# Patient Record
Sex: Female | Born: 1945 | Race: White | Hispanic: No | Marital: Married | Smoking: Never smoker
Health system: Southern US, Community
[De-identification: ages and names within clinical notes are randomized; demographics above are authoritative.]

## PROBLEM LIST (undated history)

## (undated) HISTORY — PX: TUBAL LIGATION: SHX77

---

## 2008-06-15 DIAGNOSIS — E039 Hypothyroidism, unspecified: Secondary | ICD-10-CM | POA: Insufficient documentation

## 2009-07-05 DIAGNOSIS — E785 Hyperlipidemia, unspecified: Secondary | ICD-10-CM | POA: Insufficient documentation

## 2011-07-10 DIAGNOSIS — E0500 Thyrotoxicosis with diffuse goiter without thyrotoxic crisis or storm: Secondary | ICD-10-CM | POA: Insufficient documentation

## 2012-01-26 DIAGNOSIS — F4321 Adjustment disorder with depressed mood: Secondary | ICD-10-CM | POA: Insufficient documentation

## 2013-01-17 DIAGNOSIS — Z6826 Body mass index (BMI) 26.0-26.9, adult: Secondary | ICD-10-CM | POA: Insufficient documentation

## 2013-06-20 ENCOUNTER — Ambulatory Visit (INDEPENDENT_AMBULATORY_CARE_PROVIDER_SITE_OTHER): Payer: Medicare Other | Admitting: Sports Medicine

## 2013-06-20 ENCOUNTER — Ambulatory Visit (INDEPENDENT_AMBULATORY_CARE_PROVIDER_SITE_OTHER): Payer: Medicare Other

## 2013-06-20 ENCOUNTER — Encounter: Payer: Self-pay | Admitting: Sports Medicine

## 2013-06-20 VITALS — BP 112/68 | HR 74 | Ht 60.0 in | Wt 142.0 lb

## 2013-06-20 DIAGNOSIS — M1711 Unilateral primary osteoarthritis, right knee: Secondary | ICD-10-CM

## 2013-06-20 DIAGNOSIS — M1710 Unilateral primary osteoarthritis, unspecified knee: Secondary | ICD-10-CM

## 2013-06-20 DIAGNOSIS — IMO0002 Reserved for concepts with insufficient information to code with codable children: Secondary | ICD-10-CM

## 2013-06-20 DIAGNOSIS — M25569 Pain in unspecified knee: Secondary | ICD-10-CM

## 2013-06-20 MED ORDER — MELOXICAM 15 MG PO TABS: ORAL_TABLET | ORAL | Status: DC

## 2013-06-20 NOTE — Progress Notes (Signed)
   Subjective:    I'm seeing this patient as a consultation for:  Dr. Cindie Laroche  CC: Right knee pain  HPI: This is a very pleasant 68 year old female, for some time now she's noted an increasing amount of pain and swelling in her right knee, moderate, persistent, worse at the joint lines, over-the-counter analgesics have been ineffective.  Past medical history, Surgical history, Family history not pertinant except as noted below, Social history, Allergies, and medications have been entered into the medical record, reviewed, and no changes needed.   Review of Systems: No headache, visual changes, nausea, vomiting, diarrhea, constipation, dizziness, abdominal pain, skin rash, fevers, chills, night sweats, weight loss, swollen lymph nodes, body aches, joint swelling, muscle aches, chest pain, shortness of breath, mood changes, visual or auditory hallucinations.   Objective:   General: Well Developed, well nourished, and in no acute distress.  Neuro/Psych: Alert and oriented x3, extra-ocular muscles intact, able to move all 4 extremities, sensation grossly intact. Skin: Warm and dry, no rashes noted.  Respiratory: Not using accessory muscles, speaking in full sentences, trachea midline.  Cardiovascular: Pulses palpable, no extremity edema. Abdomen: Does not appear distended. Right Knee: Visible and palpable effusion with fluid wave ROM full in flexion and extension and lower leg rotation. Ligaments with solid consistent endpoints including ACL, PCL, LCL, MCL. Negative Mcmurray's, Apley's, and Thessalonian tests. Non painful patellar compression. Patellar glide without crepitus. Patellar and quadriceps tendons unremarkable. Hamstring and quadriceps strength is normal.   Procedure: Real-time Ultrasound Guided Injection of right knee Device: GE Logiq E  Verbal informed consent obtained.  Time-out conducted.  Noted no overlying erythema, induration, or other signs of local infection.    Skin prepped in a sterile fashion.  Local anesthesia: Topical Ethyl chloride.  With sterile technique and under real time ultrasound guidance: Aspirated 10 cc of straw-colored fluid, syringe switched and 2 cc Kenalog 40, 4 cc lidocaine injected easily.  Completed without difficulty  Pain immediately resolved suggesting accurate placement of the medication.  Advised to call if fevers/chills, erythema, induration, drainage, or persistent bleeding.  Images permanently stored and available for review in the ultrasound unit.  Impression: Technically successful ultrasound guided injection.  Impression and Recommendations:   This case required medical decision making of moderate complexity.

## 2013-06-20 NOTE — Assessment & Plan Note (Signed)
Aspiration injection, Mobic, home rehabilitation, x-rays. Return in one month.

## 2013-06-23 ENCOUNTER — Telehealth: Payer: Self-pay | Admitting: *Deleted

## 2013-06-23 NOTE — Telephone Encounter (Signed)
Pt left vm stating that you pulled fluid off her knee Monday but now she's having pain again & is having some trouble walking on it.

## 2013-06-23 NOTE — Telephone Encounter (Signed)
Is very redness, swelling, fevers, or chills question mark I can add some tramadol if she would like, the steroid will start working soon.

## 2013-06-23 NOTE — Telephone Encounter (Signed)
Spoke with pt & she said the other side of her knee is swollen now & she's already made an appt to see you tomorrow.

## 2013-06-24 ENCOUNTER — Encounter: Payer: Self-pay | Admitting: Sports Medicine

## 2013-06-24 ENCOUNTER — Ambulatory Visit (INDEPENDENT_AMBULATORY_CARE_PROVIDER_SITE_OTHER): Payer: Medicare Other | Admitting: Sports Medicine

## 2013-06-24 VITALS — BP 128/73 | HR 73 | Ht 61.0 in | Wt 142.0 lb

## 2013-06-24 DIAGNOSIS — IMO0002 Reserved for concepts with insufficient information to code with codable children: Secondary | ICD-10-CM

## 2013-06-24 DIAGNOSIS — M1711 Unilateral primary osteoarthritis, right knee: Secondary | ICD-10-CM

## 2013-06-24 DIAGNOSIS — M1710 Unilateral primary osteoarthritis, unspecified knee: Secondary | ICD-10-CM

## 2013-06-24 NOTE — Progress Notes (Signed)
    Subjective:    CC: Reinjured knee  HPI: Injected this pleasant 68 year-old females knee several days back, she had only a minor improvement in her pain but unfortunately twisted her knee recently and has a recurrence of pain in the posterior lateral joint line. Is moderate, persistent.  Past medical history, Surgical history, Family history not pertinant except as noted below, Social history, Allergies, and medications have been entered into the medical record, reviewed, and no changes needed.   Review of Systems: No fevers, chills, night sweats, weight loss, chest pain, or shortness of breath.   Objective:    General: Well Developed, well nourished, and in no acute distress.  Neuro: Alert and oriented x3, extra-ocular muscles intact, sensation grossly intact.  HEENT: Normocephalic, atraumatic, pupils equal round reactive to light, neck supple, no masses, no lymphadenopathy, thyroid nonpalpable.  Skin: Warm and dry, no rashes. Cardiac: Regular rate and rhythm, no murmurs rubs or gallops, no lower extremity edema. Respiratory: Clear to auscultation bilaterally. Not using accessory muscles, speaking in full sentences. Right Knee: Normal to inspection with no erythema or effusion or obvious bony abnormalities. Tender to palpation at the joint line posteriorly and medially. ROM full in flexion and extension and lower leg rotation. Ligaments with solid consistent endpoints including ACL, PCL, LCL, MCL. Negative Mcmurray's, Apley's, and Thessalonian tests. Non painful patellar compression. Patellar glide without crepitus. Patellar and quadriceps tendons unremarkable. Hamstring and quadriceps strength is normal.   Procedure: Real-time Ultrasound Guided Injection of right knee Device: GE Logiq E  Verbal informed consent obtained.  Time-out conducted.  Noted no overlying erythema, induration, or other signs of local infection.  Skin prepped in a sterile fashion.  Local anesthesia:  Topical Ethyl chloride.  With sterile technique and under real time ultrasound guidance:  1 cc Depo-Medrol 44 cc lidocaine injected easily into the suprapatellar recess. Completed without difficulty  Pain immediately resolved suggesting accurate placement of the medication.  Advised to call if fevers/chills, erythema, induration, drainage, or persistent bleeding.  Images permanently stored and available for review in the ultrasound unit.  Impression: Technically successful ultrasound guided injection.  Impression and Recommendations:

## 2013-06-24 NOTE — Assessment & Plan Note (Signed)
Repeat injection today, I do think she had an unfortunate twisting injury and has an early degenerative meniscal tear. Return in a month. Home rehabilitation, strap with compressive dressing.

## 2013-07-18 ENCOUNTER — Ambulatory Visit: Payer: Medicare Other | Admitting: Sports Medicine

## 2013-07-22 ENCOUNTER — Encounter: Payer: Self-pay | Admitting: Sports Medicine

## 2013-07-22 ENCOUNTER — Ambulatory Visit (INDEPENDENT_AMBULATORY_CARE_PROVIDER_SITE_OTHER): Payer: Medicare Other | Admitting: Sports Medicine

## 2013-07-22 VITALS — BP 109/65 | HR 78 | Ht 61.0 in | Wt 143.0 lb

## 2013-07-22 DIAGNOSIS — M1711 Unilateral primary osteoarthritis, right knee: Secondary | ICD-10-CM

## 2013-07-22 DIAGNOSIS — M1710 Unilateral primary osteoarthritis, unspecified knee: Secondary | ICD-10-CM

## 2013-07-22 DIAGNOSIS — M722 Plantar fascial fibromatosis: Secondary | ICD-10-CM

## 2013-07-22 MED ORDER — TRAMADOL HCL 50 MG PO TABS: ORAL_TABLET | ORAL | Status: DC

## 2013-07-22 NOTE — Assessment & Plan Note (Signed)
Very mild, but I do thing she has a degenerative medial meniscal tear. She has now failed 2 injections, home rehabilitation, NSAIDs, bracing. I think she is now a candidate for a knee arthroscopy, referral to Dr. Luiz BlareGraves.

## 2013-07-22 NOTE — Assessment & Plan Note (Signed)
Home rehabilitation exercises, tramadol, return for custom orthotics.

## 2013-07-22 NOTE — Progress Notes (Signed)
  Subjective:    CC: Followup  HPI: Right knee pain: Unfortunately now pain is persistent, at the medial joint line, after 2 ultrasound guided intra-articular injections, rehabilitation, bracing, and NSAIDs. No mechanical symptoms.  Right foot pain: She has noted nodules along her plantar fascia, they are present bilaterally but the right side is tender. It is mild, persistent. No radiation.  Past medical history, Surgical history, Family history not pertinant except as noted below, Social history, Allergies, and medications have been entered into the medical record, reviewed, and no changes needed.   Review of Systems: No fevers, chills, night sweats, weight loss, chest pain, or shortness of breath.   Objective:    General: Well Developed, well nourished, and in no acute distress.  Neuro: Alert and oriented x3, extra-ocular muscles intact, sensation grossly intact.  HEENT: Normocephalic, atraumatic, pupils equal round reactive to light, neck supple, no masses, no lymphadenopathy, thyroid nonpalpable.  Skin: Warm and dry, no rashes. Cardiac: Regular rate and rhythm, no murmurs rubs or gallops, no lower extremity edema.  Respiratory: Clear to auscultation bilaterally. Not using accessory muscles, speaking in full sentences. Right Knee: Normal to inspection with no erythema or effusion or obvious bony abnormalities. Tender to palpation at the medial joint line. ROM normal in flexion and extension and lower leg rotation. Ligaments with solid consistent endpoints including ACL, PCL, LCL, MCL. Negative Mcmurray's and provocative meniscal tests. Non painful patellar compression. Patellar and quadriceps tendons unremarkable. Hamstring and quadriceps strength is normal. Right Foot: No visible erythema or swelling. Range of motion is full in all directions. Strength is 5/5 in all directions. No hallux valgus. No pes cavus or pes planus. No abnormal callus noted. No pain over the navicular  prominence, or base of fifth metatarsal. No tenderness to palpation of the calcaneal insertion of plantar fascia. There is tenderness to palpation along the mid right plantar fascia with palpable plantar fascia fibromatosis. No pain at the Achilles insertion. No pain over the calcaneal bursa. No pain of the retrocalcaneal bursa. No tenderness to palpation over the tarsals, metatarsals, or phalanges. No hallux rigidus or limitus. No tenderness palpation over interphalangeal joints. No pain with compression of the metatarsal heads. Neurovascularly intact distally.  Impression and Recommendations:

## 2013-08-01 ENCOUNTER — Encounter: Payer: Self-pay | Admitting: Sports Medicine

## 2013-08-01 ENCOUNTER — Ambulatory Visit (INDEPENDENT_AMBULATORY_CARE_PROVIDER_SITE_OTHER): Payer: Medicare Other | Admitting: Sports Medicine

## 2013-08-01 VITALS — BP 114/67 | HR 79 | Wt 145.0 lb

## 2013-08-01 DIAGNOSIS — M1711 Unilateral primary osteoarthritis, right knee: Secondary | ICD-10-CM

## 2013-08-01 DIAGNOSIS — M1710 Unilateral primary osteoarthritis, unspecified knee: Secondary | ICD-10-CM

## 2013-08-01 DIAGNOSIS — M722 Plantar fascial fibromatosis: Secondary | ICD-10-CM

## 2013-08-01 NOTE — Progress Notes (Signed)
    Patient was fitted for a : standard, cushioned, semi-rigid orthotic. The orthotic was heated and afterward the patient stood on the orthotic blank positioned on the orthotic stand. The patient was positioned in subtalar neutral position and 10 degrees of ankle dorsiflexion in a weight bearing stance. After completion of molding, a stable base was applied to the orthotic blank. The blank was ground to a stable position for weight bearing. Size:6 Base: Blue EVA Additional Posting and Padding: None The patient ambulated these, and they were very comfortable.  I spent 40 minutes with this patient, greater than 50% was face-to-face time counseling regarding the below diagnosis.   

## 2013-08-01 NOTE — Assessment & Plan Note (Signed)
Actually doing better to the point where she will actually not need an arthroscopy. She is going to cancel the appointment with Dr. Luiz BlareGraves.

## 2013-08-01 NOTE — Assessment & Plan Note (Signed)
Custom orthotics as above. Return in a month. 

## 2013-08-29 ENCOUNTER — Ambulatory Visit: Payer: Medicare Other | Admitting: Sports Medicine

## 2013-10-12 ENCOUNTER — Ambulatory Visit (INDEPENDENT_AMBULATORY_CARE_PROVIDER_SITE_OTHER): Payer: Medicare Other | Admitting: Sports Medicine

## 2013-10-12 ENCOUNTER — Encounter: Payer: Self-pay | Admitting: Sports Medicine

## 2013-10-12 VITALS — BP 114/67 | HR 83 | Ht 61.0 in | Wt 145.0 lb

## 2013-10-12 DIAGNOSIS — M1710 Unilateral primary osteoarthritis, unspecified knee: Secondary | ICD-10-CM

## 2013-10-12 DIAGNOSIS — S838X9A Sprain of other specified parts of unspecified knee, initial encounter: Secondary | ICD-10-CM

## 2013-10-12 DIAGNOSIS — S86819A Strain of other muscle(s) and tendon(s) at lower leg level, unspecified leg, initial encounter: Secondary | ICD-10-CM

## 2013-10-12 DIAGNOSIS — M1711 Unilateral primary osteoarthritis, right knee: Secondary | ICD-10-CM

## 2013-10-12 DIAGNOSIS — S86111A Strain of other muscle(s) and tendon(s) of posterior muscle group at lower leg level, right leg, initial encounter: Secondary | ICD-10-CM | POA: Insufficient documentation

## 2013-10-12 NOTE — Progress Notes (Signed)
  Subjective:    CC: Followup  HPI: This is a pleasant 68 year old female, I have been treating her for knee osteoarthritis with degenerative meniscal tear. Initially she did not respond to steroid injections, I sent her to [facility] and they recommended physical therapy without any surgery. Overall she was doing very well until recently, she took a misstep, several days ago, and had immediate pain in her gastrocnemius. Mostly the medial head. Now it is painful to walk. Minimal swelling, no bruising, no shortness of breath. Moderate, persistent pain.  Past medical history, Surgical history, Family history not pertinant except as noted below, Social history, Allergies, and medications have been entered into the medical record, reviewed, and no changes needed.   Review of Systems: No fevers, chills, night sweats, weight loss, chest pain, or shortness of breath.   Objective:    General: Well Developed, well nourished, and in no acute distress.  Neuro: Alert and oriented x3, extra-ocular muscles intact, sensation grossly intact.  HEENT: Normocephalic, atraumatic, pupils equal round reactive to light, neck supple, no masses, no lymphadenopathy, thyroid nonpalpable.  Skin: Warm and dry, no rashes. Cardiac: Regular rate and rhythm, no murmurs rubs or gallops, no lower extremity edema.  Respiratory: Clear to auscultation bilaterally. Not using accessory muscles, speaking in full sentences. Right leg: Tender to palpation at the musculotendinous junction of the medial gastrocnemius. Negative Homans sign, neurovascularly intact distally. Knee is entirely nontender, even at the joint lines and patellar facets.  Entire lower leg was strapped with compressive dressing.  Impression and Recommendations:

## 2013-10-12 NOTE — Assessment & Plan Note (Signed)
Actually doing well after 2 injections, she did go [facility], it was recommended that she do some more rehabilitation.

## 2013-10-12 NOTE — Assessment & Plan Note (Signed)
Occurred 4 days ago. Strapped with compressive dressing, heel lift, home rehabilitation. Return in 2 weeks, injection if no better.

## 2013-10-26 ENCOUNTER — Ambulatory Visit: Payer: Medicare Other | Admitting: Sports Medicine

## 2014-05-09 ENCOUNTER — Ambulatory Visit (INDEPENDENT_AMBULATORY_CARE_PROVIDER_SITE_OTHER): Payer: Medicare Other | Admitting: Sports Medicine

## 2014-05-09 ENCOUNTER — Encounter: Payer: Self-pay | Admitting: Sports Medicine

## 2014-05-09 VITALS — BP 116/68 | HR 82 | Ht 61.0 in | Wt 144.0 lb

## 2014-05-09 DIAGNOSIS — M9261 Juvenile osteochondrosis of tarsus, right ankle: Secondary | ICD-10-CM | POA: Insufficient documentation

## 2014-05-09 MED ORDER — NAPROXEN-ESOMEPRAZOLE 500-20 MG PO TBEC: 1.0000 | DELAYED_RELEASE_TABLET | Freq: Two times a day (BID) | ORAL | Status: DC

## 2014-05-09 MED ORDER — NITROGLYCERIN 0.2 MG/HR TD PT24: MEDICATED_PATCH | TRANSDERMAL | Status: DC

## 2014-05-09 NOTE — Assessment & Plan Note (Signed)
Heel lift, nitroglycerin patches, Achilles rehabilitation exercises. [facility] [place]. Return in 4 weeks.

## 2014-05-09 NOTE — Progress Notes (Signed)
  Subjective:    CC: Right foot pain  HPI: For the past several weeks Bonnie Duke has had increasing pain in the posterior aspect of her right heel, laterally. [facility] where x-rays showed a plantar calcaneal spur as well as an Achilles spur. Pain is mild, persistent, she hasn't tried any specific interventions at this point.  Past medical history, Surgical history, Family history not pertinant except as noted below, Social history, Allergies, and medications have been entered into the medical record, reviewed, and no changes needed.   Review of Systems: No fevers, chills, night sweats, weight loss, chest pain, or shortness of breath.   Objective:    General: Well Developed, well nourished, and in no acute distress.  Neuro: Alert and oriented x3, extra-ocular muscles intact, sensation grossly intact.  HEENT: Normocephalic, atraumatic, pupils equal round reactive to light, neck supple, no masses, no lymphadenopathy, thyroid nonpalpable.  Skin: Warm and dry, no rashes. Cardiac: Regular rate and rhythm, no murmurs rubs or gallops, no lower extremity edema.  Respiratory: Clear to auscultation bilaterally. Not using accessory muscles, speaking in full sentences. Right Foot: No visible erythema or swelling. Range of motion is full in all directions. Strength is 5/5 in all directions. No hallux valgus. No pes cavus or pes planus. No abnormal callus noted. No pain over the navicular prominence, or base of fifth metatarsal. No tenderness to palpation of the calcaneal insertion of plantar fascia. No pain at the Achilles insertion. No pain over the calcaneal bursa. No pain of the retrocalcaneal bursa. No tenderness to palpation over the tarsals, metatarsals, or phalanges. No hallux rigidus or limitus. No tenderness palpation over interphalangeal joints. No pain with compression of the metatarsal heads. Neurovascularly intact distally. Visible posterolateral Haglund's  deformity with tenderness.  Impression and Recommendations:

## 2014-06-06 ENCOUNTER — Ambulatory Visit: Payer: Medicare Other | Admitting: Sports Medicine

## 2014-07-12 ENCOUNTER — Encounter: Payer: Self-pay | Admitting: Emergency Medicine

## 2014-07-12 ENCOUNTER — Emergency Department (INDEPENDENT_AMBULATORY_CARE_PROVIDER_SITE_OTHER): Payer: Medicare Other

## 2014-07-12 ENCOUNTER — Emergency Department
Admission: EM | Admit: 2014-07-12 | Discharge: 2014-07-12 | Disposition: A | Discharge status: Home / Self Care | Payer: Medicare Other | Source: Home / Self Care | Attending: Family Medicine | Admitting: Family Medicine

## 2014-07-12 DIAGNOSIS — S62639A Displaced fracture of distal phalanx of unspecified finger, initial encounter for closed fracture: Secondary | ICD-10-CM | POA: Diagnosis not present

## 2014-07-12 DIAGNOSIS — S62631A Displaced fracture of distal phalanx of left index finger, initial encounter for closed fracture: Secondary | ICD-10-CM | POA: Diagnosis not present

## 2014-07-12 DIAGNOSIS — M20019 Mallet finger of unspecified finger(s): Secondary | ICD-10-CM

## 2014-07-12 DIAGNOSIS — X58XXXA Exposure to other specified factors, initial encounter: Secondary | ICD-10-CM | POA: Diagnosis not present

## 2014-07-12 NOTE — ED Notes (Signed)
Had crushing injury of left index finger one hour ago; unable to bend at first joint; some edema and mild discoloration.

## 2014-07-12 NOTE — Discharge Instructions (Signed)
Wear splint constantly.  Apply ice pack for 20 to 30 minutes, every 3 to 4 hours for 2 to 3 days.  Continue until pain decreases.  May take Ibuprofen or Tylenol as needed.  Begin range of motion exercises when healed.

## 2014-07-12 NOTE — ED Provider Notes (Signed)
CSN: [id number]     Arrival date & time 07/12/14  1609 History   First MD Initiated Contact with Patient 07/12/14 1641     Chief Complaint  Patient presents with  . Hand Pain     HPI Comments: Patient contused the distal phalanx of her left second finger about 1.5 hours ago and has pain in the distal phalanx.  Patient is a 69 y.o. female presenting with hand pain. The history is provided by the patient and the spouse.  Hand Pain This is a new problem. The current episode started 1 to 2 hours ago. The problem occurs constantly. The problem has not changed since onset.Associated symptoms comments: Inabilitiy to fully extend the distal phalanx of her left second finger. Exacerbated by: movement of finger. Nothing relieves the symptoms. Treatments tried: ice pack. The treatment provided no relief.    History reviewed. No pertinent past medical history. History reviewed. No pertinent past surgical history. History reviewed. No pertinent family history. History  Substance Use Topics  . Smoking status: Never Smoker   . Smokeless tobacco: Not on file  . Alcohol Use: Not on file   OB History    No data available     Review of Systems  All other systems reviewed and are negative.   Allergies  Aspirin; Biaxin; Codeine; Pneumovax; and Sulfa antibiotics  Home Medications   Prior to Admission medications   Medication Sig Start Date End Date Taking? Authorizing Provider  balsalazide (COLAZAL) 750 MG capsule Take 2,250 mg by mouth 3 (three) times daily.    Historical Provider, MD  citalopram (CELEXA) 20 MG tablet Take 20 mg by mouth daily.    Historical Provider, MD  levothyroxine (SYNTHROID, LEVOTHROID) 75 MCG tablet Take 75 mcg by mouth daily before breakfast.    Historical Provider, MD  meloxicam (MOBIC) 15 MG tablet One tab PO qAM with breakfast for 2 weeks, then daily prn pain. 06/20/13   Monica Becton, MD  Naproxen-Esomeprazole 500-20 MG TBEC Take 1 tablet by mouth 2 (two) times  daily. 05/09/14   Monica Becton, MD  nitroGLYCERIN (NITRODUR - DOSED IN MG/24 HR) 0.2 mg/hr patch Cut and apply 1/4 patch to most painful area q24h. 05/09/14   Monica Becton, MD  simvastatin (ZOCOR) 40 MG tablet Take 40 mg by mouth daily.    Historical Provider, MD  traMADol (ULTRAM) 50 MG tablet 1-2 tabs by mouth Q8 hours, maximum 6 tabs per day. 07/22/13   Monica Becton, MD   BP 122/74 mmHg  Pulse 76  Temp(Src) 98.3 F (36.8 C) (Oral)  Resp 16  Ht 5' (1.524 m)  Wt 142 lb (64.411 kg)  BMI 27.73 kg/m2  SpO2 96% Physical Exam  Constitutional: She is oriented to person, place, and time. She appears well-developed and well-nourished. No distress.  HENT:  Head: Normocephalic.  Eyes: Pupils are equal, round, and reactive to light.  Musculoskeletal:       Left hand: She exhibits decreased range of motion, tenderness, bony tenderness and swelling. She exhibits normal two-point discrimination, normal capillary refill, no deformity and no laceration. Normal sensation noted.       Hands: Left second finger has tenderness to palpation over the DIP joint, especially the dorsal surface.  She is unable to fully extend her distal phalanx.  Active flexion is intact.  Distal neurovascular function is intact.  Neurological: She is alert and oriented to person, place, and time.  Skin: Skin is warm and dry.  Nursing note and vitals reviewed.   ED Course  Procedures  none  Imaging Review Dg Finger Index Left  07/12/2014   CLINICAL DATA:  Injury, distal index finger pain  EXAM: LEFT INDEX FINGER 2+V  COMPARISON:  None.  FINDINGS: Three views of the left second finger submitted. There is small avulsion fracture dorsal aspect at the base of distal phalanx noted on lateral view.  IMPRESSION: Small avulsion fracture dorsal aspect at the base  of distal phalanx noted on lateral view   Electronically Signed   By: Natasha MeadLiviu  Pop M.D.   On: 07/12/2014 16:43     MDM   1. Closed mallet fracture of distal phalanx of finger, initial encounter     Stack splint applied to maintain distal phalanx in extension. Wear splint constantly.  Apply ice pack for 20 to 30 minutes, every 3 to 4 hours for 2 to 3 days.  Continue until pain decreases.  May take Ibuprofen or Tylenol as needed.  Begin range of motion exercises when healed. Followup with Dr. Rodney Langtonhomas Thekkekandam ([facility]    Lattie HawStephen A Gilles Trimpe, MD 07/12/14 Zollie [id number]

## 2014-07-27 ENCOUNTER — Ambulatory Visit (INDEPENDENT_AMBULATORY_CARE_PROVIDER_SITE_OTHER): Payer: Medicare Other | Admitting: Family Medicine

## 2014-07-27 ENCOUNTER — Encounter: Payer: Self-pay | Admitting: Family Medicine

## 2014-07-27 ENCOUNTER — Ambulatory Visit (INDEPENDENT_AMBULATORY_CARE_PROVIDER_SITE_OTHER): Payer: Medicare Other

## 2014-07-27 VITALS — BP 106/54 | HR 90 | Wt 145.0 lb

## 2014-07-27 DIAGNOSIS — S62631D Displaced fracture of distal phalanx of left index finger, subsequent encounter for fracture with routine healing: Secondary | ICD-10-CM | POA: Diagnosis not present

## 2014-07-27 DIAGNOSIS — X58XXXD Exposure to other specified factors, subsequent encounter: Secondary | ICD-10-CM | POA: Diagnosis not present

## 2014-07-27 DIAGNOSIS — M20012 Mallet finger of left finger(s): Secondary | ICD-10-CM

## 2014-07-27 NOTE — Patient Instructions (Signed)
Thank you for coming in today. Return in 2 weeks.  Work on flexion of the middle knuckle.

## 2014-07-27 NOTE — Assessment & Plan Note (Addendum)
Mallet finger left index finger. Continue stack splint X-ray today. Return in 2 weeks. Work on range of motion of PIP New problem for this location and myself.

## 2014-07-27 NOTE — Progress Notes (Signed)
Bonnie Duke is a 69 y.o. female who [facility]  today for follow-up mallet finger. Patient was seen in urgent care 2 weeks ago after suffering a avulsion fracture with mallet finger of the left index finger. She was placed into a stack splint and is here today to follow-up. She is feeling well and tolerating a stack splint well. She has not attempted flexion of the PIP or DIP of the left second digit. She denies any skin irritation. No fevers chills nausea vomiting or diarrhea.   No past medical history on file. No past surgical history on file. History  Substance Use Topics  . Smoking status: Never Smoker   . Smokeless tobacco: Not on file  . Alcohol Use: Not on file   ROS as above Medications: Current Outpatient Prescriptions  Medication Sig Dispense Refill  . balsalazide (COLAZAL) 750 MG capsule Take 2,250 mg by mouth 3 (three) times daily.    . citalopram (CELEXA) 20 MG tablet Take 20 mg by mouth daily.    Marland Kitchen. levothyroxine (SYNTHROID, LEVOTHROID) 75 MCG tablet Take 75 mcg by mouth daily before breakfast.    . meloxicam (MOBIC) 15 MG tablet One tab PO qAM with breakfast for 2 weeks, then daily prn pain. 30 tablet 3  . Naproxen-Esomeprazole 500-20 MG TBEC Take 1 tablet by mouth 2 (two) times daily. 12 tablet 0  . nitroGLYCERIN (NITRODUR - DOSED IN MG/24 HR) 0.2 mg/hr patch Cut and apply 1/4 patch to most painful area q24h. 30 patch 11  . simvastatin (ZOCOR) 40 MG tablet Take 40 mg by mouth daily.    . traMADol (ULTRAM) 50 MG tablet 1-2 tabs by mouth Q8 hours, maximum 6 tabs per day. 90 tablet 0   No current facility-administered medications for this visit.   Allergies  Allergen Reactions  . Aspirin   . Biaxin [Clarithromycin]   . Codeine   . Pneumovax [Pneumococcal Polysaccharide Vaccine]   . Sulfa Antibiotics      Exam:  BP 106/54 mmHg  Pulse 90  Wt 145 lb (65.772 kg) Gen: Well NAD Left index finger swollen mildly tender  DIP. Small extensor lag present.Marland Kitchen. PIP is normal-appearing with decreased range of motion active and passive to about 80 flexion. MCP is normal Exts: Brisk capillary refill, warm and well perfused.   No results found for this or any previous visit (from the past 24 hour(s)). No results found.   Please see individual assessment and plan sections. This visit required moderate complexity and decision making.

## 2014-08-10 ENCOUNTER — Ambulatory Visit (INDEPENDENT_AMBULATORY_CARE_PROVIDER_SITE_OTHER): Payer: Medicare Other

## 2014-08-10 ENCOUNTER — Ambulatory Visit (INDEPENDENT_AMBULATORY_CARE_PROVIDER_SITE_OTHER): Payer: Medicare Other | Admitting: Family Medicine

## 2014-08-10 ENCOUNTER — Encounter: Payer: Self-pay | Admitting: Family Medicine

## 2014-08-10 VITALS — BP 105/62 | HR 75 | Wt 145.0 lb

## 2014-08-10 DIAGNOSIS — M20012 Mallet finger of left finger(s): Secondary | ICD-10-CM

## 2014-08-10 NOTE — Progress Notes (Signed)
Quick Note:  Slowly healing ______

## 2014-08-10 NOTE — Assessment & Plan Note (Signed)
Doing well recheck in 2 weeks. If no bony healing will ultrasound for bridging

## 2014-08-10 NOTE — Progress Notes (Signed)
Bonnie Duke is a 69 y.o. female who presents to [facility]  today for left index finger mallet finger. 4 weeks status post injury. Patient is doing well with immobilization. She notes some stiffness at the PIP but is working actively for range of motion. No complaints Today. No fevers or chills.   No past medical history on file. No past surgical history on file. History  Substance Use Topics  . Smoking status: Never Smoker   . Smokeless tobacco: Not on file  . Alcohol Use: Not on file   ROS as above Medications: Current Outpatient Prescriptions  Medication Sig Dispense Refill  . balsalazide (COLAZAL) 750 MG capsule Take 2,250 mg by mouth 3 (three) times daily.    . citalopram (CELEXA) 20 MG tablet Take 20 mg by mouth daily.    Marland Kitchen levothyroxine (SYNTHROID, LEVOTHROID) 75 MCG tablet Take 75 mcg by mouth daily before breakfast.    . meloxicam (MOBIC) 15 MG tablet One tab PO qAM with breakfast for 2 weeks, then daily prn pain. 30 tablet 3  . Naproxen-Esomeprazole 500-20 MG TBEC Take 1 tablet by mouth 2 (two) times daily. 12 tablet 0  . nitroGLYCERIN (NITRODUR - DOSED IN MG/24 HR) 0.2 mg/hr patch Cut and apply 1/4 patch to most painful area q24h. 30 patch 11  . simvastatin (ZOCOR) 40 MG tablet Take 40 mg by mouth daily.    . traMADol (ULTRAM) 50 MG tablet 1-2 tabs by mouth Q8 hours, maximum 6 tabs per day. 90 tablet 0   No current facility-administered medications for this visit.   Allergies  Allergen Reactions  . Aspirin   . Biaxin [Clarithromycin]   . Codeine   . Pneumovax [Pneumococcal Polysaccharide Vaccine]   . Sulfa Antibiotics      Exam:  BP 105/62 mmHg  Pulse 75  Wt 145 lb (65.772 kg) Gen: Well NAD Well-appearing left index finger DIP. Mild skin redness. Slight extensor lag present. Range of motion of the DIP was not tested. Decreased flexion of the PIP present. Exts: Brisk capillary refill, warm and well perfused.   No results  found for this or any previous visit (from the past 24 hour(s)). Dg Finger Index Left  08/10/2014   CLINICAL DATA:  Evaluate left index finger for mallet deformity  EXAM: LEFT INDEX FINGER 2+V  COMPARISON:  July 27, 2014  FINDINGS: There is subtle bony density adjacent to the dorsal aspect of the distal portion of the middle phalanx of the index finger. This likely reflects the avulsion fracture fragment from the dorsal last of packed of the base of the distal phalanx. There is mild overlying soft tissue swelling. The PIP joint is unremarkable.  IMPRESSION: There is no significant change in the appearance of the DIP joint and associated avulsion from the dorsal aspect of the base of the distal phalanx.   Electronically Signed   By: David  [place] M.D.   On: 08/10/2014 11:05     Please see individual assessment and plan sections.

## 2014-08-10 NOTE — Patient Instructions (Signed)
Thank you for coming in today. Continue the brace.  Use aspercream Return in 2 weeks.

## 2014-08-24 ENCOUNTER — Encounter: Payer: Self-pay | Admitting: Family Medicine

## 2014-08-24 ENCOUNTER — Ambulatory Visit (INDEPENDENT_AMBULATORY_CARE_PROVIDER_SITE_OTHER): Payer: Medicare Other | Admitting: Family Medicine

## 2014-08-24 ENCOUNTER — Ambulatory Visit (INDEPENDENT_AMBULATORY_CARE_PROVIDER_SITE_OTHER): Payer: Medicare Other

## 2014-08-24 VITALS — BP 129/68 | HR 73 | Wt 146.0 lb

## 2014-08-24 DIAGNOSIS — M20012 Mallet finger of left finger(s): Secondary | ICD-10-CM

## 2014-08-24 DIAGNOSIS — X58XXXD Exposure to other specified factors, subsequent encounter: Secondary | ICD-10-CM | POA: Diagnosis not present

## 2014-08-24 DIAGNOSIS — S62631D Displaced fracture of distal phalanx of left index finger, subsequent encounter for fracture with routine healing: Secondary | ICD-10-CM | POA: Diagnosis not present

## 2014-08-24 NOTE — Progress Notes (Signed)
Bonnie Duke is a 69 y.o. female who presents to [facility]  today for  left index finger mallet finger. 4 weeks status post injury. Patient is doing well with immobilization. She notes some stiffness at the PIP but is working actively for range of motion. No complaints Today. No fevers or chills.    No past medical history on file. No past surgical history on file. Social History  Substance Use Topics  . Smoking status: Never Smoker   . Smokeless tobacco: Not on file  . Alcohol Use: Not on file   ROS as above Medications: Current Outpatient Prescriptions  Medication Sig Dispense Refill  . balsalazide (COLAZAL) 750 MG capsule Take 2,250 mg by mouth 3 (three) times daily.    . citalopram (CELEXA) 20 MG tablet Take 20 mg by mouth daily.    Marland Kitchen levothyroxine (SYNTHROID, LEVOTHROID) 75 MCG tablet Take 75 mcg by mouth daily before breakfast.    . meloxicam (MOBIC) 15 MG tablet One tab PO qAM with breakfast for 2 weeks, then daily prn pain. 30 tablet 3  . Naproxen-Esomeprazole 500-20 MG TBEC Take 1 tablet by mouth 2 (two) times daily. 12 tablet 0  . nitroGLYCERIN (NITRODUR - DOSED IN MG/24 HR) 0.2 mg/hr patch Cut and apply 1/4 patch to most painful area q24h. 30 patch 11  . simvastatin (ZOCOR) 40 MG tablet Take 40 mg by mouth daily.    . traMADol (ULTRAM) 50 MG tablet 1-2 tabs by mouth Q8 hours, maximum 6 tabs per day. 90 tablet 0   No current facility-administered medications for this visit.   Allergies  Allergen Reactions  . Aspirin   . Biaxin [Clarithromycin]   . Codeine   . Pneumovax [Pneumococcal Polysaccharide Vaccine]   . Sulfa Antibiotics      Exam:  BP 129/68 mmHg  Pulse 73  Wt 146 lb (66.225 kg) Gen: Well NAD Well-appearing left index finger DIP. Mild skin redness. Slight extensor lag present. Range of motion of the DIP was not tested. Decreased flexion of the PIP present. Exts: Brisk capillary refill, warm and well perfused.    Plan x-ray review shows a partially healed mallet fracture. Limited musculoskeletal ultrasound shows a avulsion fracture fragment with some bridging callus formation but not complete healing. Increased Doppler activity at the fracture site  No results found for this or any previous visit (from the past 24 hour(s)). No results found.   Please see individual assessment and plan sections.

## 2014-08-24 NOTE — Assessment & Plan Note (Addendum)
Not yet fully healed per my interpretation of x-ray. Continue splint for 2 or 3 more weeks return in 2- 3 weeks

## 2014-08-24 NOTE — Progress Notes (Signed)
Quick Note:  Xray does show some bone healing! ______

## 2014-08-24 NOTE — Patient Instructions (Signed)
Thank you for coming in today. Return in 2 weeks.  Continue the splint.

## 2014-09-14 ENCOUNTER — Ambulatory Visit (INDEPENDENT_AMBULATORY_CARE_PROVIDER_SITE_OTHER): Payer: Medicare Other

## 2014-09-14 ENCOUNTER — Encounter: Payer: Self-pay | Admitting: Family Medicine

## 2014-09-14 ENCOUNTER — Ambulatory Visit (INDEPENDENT_AMBULATORY_CARE_PROVIDER_SITE_OTHER): Payer: Medicare Other | Admitting: Family Medicine

## 2014-09-14 VITALS — BP 155/64 | HR 75 | Wt 145.0 lb

## 2014-09-14 DIAGNOSIS — M20012 Mallet finger of left finger(s): Secondary | ICD-10-CM

## 2014-09-14 DIAGNOSIS — S62631D Displaced fracture of distal phalanx of left index finger, subsequent encounter for fracture with routine healing: Secondary | ICD-10-CM | POA: Diagnosis not present

## 2014-09-14 DIAGNOSIS — X58XXXD Exposure to other specified factors, subsequent encounter: Secondary | ICD-10-CM

## 2014-09-14 NOTE — Progress Notes (Signed)
Bonnie Duke is a 69 y.o. female who presents to [facility]  today for follow-up left index finger mallet finger. Doing well. She does the splint is very irritating and she does not want to wear it in warm. She's been out of the splint some since her last visit. She notes continued difficulty flexing her PIP. No radiating pain weakness or numbness fevers or chills.   No past medical history on file. No past surgical history on file. Social History  Substance Use Topics  . Smoking status: Never Smoker   . Smokeless tobacco: Not on file  . Alcohol Use: Not on file   family history is not on file.  ROS as above Medications: Current Outpatient Prescriptions  Medication Sig Dispense Refill  . citalopram (CELEXA) 20 MG tablet Take 20 mg by mouth daily.    Marland Kitchen levothyroxine (SYNTHROID, LEVOTHROID) 75 MCG tablet Take 75 mcg by mouth daily before breakfast.    . simvastatin (ZOCOR) 40 MG tablet Take 40 mg by mouth daily.     No current facility-administered medications for this visit.   Allergies  Allergen Reactions  . Aspirin   . Biaxin [Clarithromycin]   . Codeine   . Pneumovax [Pneumococcal Polysaccharide Vaccine]   . Sulfa Antibiotics      Exam:  BP 155/64 mmHg  Pulse 75  Wt 145 lb (65.772 kg) Gen: Well NAD HEENT: EOMI,  MMM Lungs: Normal work of breathing. CTABL Heart: RRR no MRG Abd: NABS, Soft. Nondistended, Nontender Exts: Brisk capillary refill, warm and well perfused.  Left index finger normal-appearing nontender. Decreased motion PIP. DIP motion not tested.  My interpretation of the x-ray today of the left index finger shows incomplete healed mallet fracture.  No results found for this or any previous visit (from the past 24 hour(s)). No results found.   Please see individual assessment and plan sections.

## 2014-09-14 NOTE — Patient Instructions (Signed)
Thank you for coming in today. Attend hand PT.  Return in 3-4 weeks.   [facility] - [address] [place], [place] [address] P: [phone] F: [phone]

## 2014-09-14 NOTE — Progress Notes (Signed)
Quick Note:  Not much bone healing. Would advise continued splint. If she is tired of splint she can just stop it. ______

## 2014-09-14 NOTE — Assessment & Plan Note (Signed)
Start hand therapy. Recheck in 2 weeks Awaiting formal radiology read.

## 2014-10-06 ENCOUNTER — Telehealth: Payer: Self-pay | Admitting: Internal Medicine

## 2014-10-06 ENCOUNTER — Ambulatory Visit (INDEPENDENT_AMBULATORY_CARE_PROVIDER_SITE_OTHER): Payer: Medicare Other

## 2014-10-06 DIAGNOSIS — X58XXXD Exposure to other specified factors, subsequent encounter: Secondary | ICD-10-CM | POA: Diagnosis not present

## 2014-10-06 DIAGNOSIS — S62639G Displaced fracture of distal phalanx of unspecified finger, subsequent encounter for fracture with delayed healing: Principal | ICD-10-CM

## 2014-10-06 DIAGNOSIS — S62631D Displaced fracture of distal phalanx of left index finger, subsequent encounter for fracture with routine healing: Secondary | ICD-10-CM | POA: Diagnosis not present

## 2014-10-06 DIAGNOSIS — M20019 Mallet finger of unspecified finger(s): Secondary | ICD-10-CM

## 2014-10-06 NOTE — Telephone Encounter (Signed)
X-ray ordered.

## 2014-10-06 NOTE — Telephone Encounter (Signed)
Pt called and states that she is still doing Physical Therapy but her and Therapist would like to have another xray of finger. She is able to bend a little now but will you go ahead an order xray and call with results.. ? Call pt and let her know. Thanks, [employer]

## 2014-10-09 ENCOUNTER — Telehealth: Payer: Self-pay | Admitting: Family Medicine

## 2014-10-09 NOTE — Telephone Encounter (Signed)
Pt called and wants to know the status of her x-ray so she can let her physical therepist know... She wants to know if it is fully healed

## 2014-10-09 NOTE — Telephone Encounter (Signed)
Pts husband notified of results

## 2014-10-09 NOTE — Progress Notes (Signed)
Quick Note:  Xray did not show that fracture well. I think its healing. ______

## 2014-10-12 ENCOUNTER — Ambulatory Visit: Payer: Medicare Other | Admitting: Family Medicine

## 2014-10-13 ENCOUNTER — Encounter: Payer: Self-pay | Admitting: Family Medicine

## 2014-10-13 ENCOUNTER — Ambulatory Visit (INDEPENDENT_AMBULATORY_CARE_PROVIDER_SITE_OTHER): Payer: Medicare Other | Admitting: Family Medicine

## 2014-10-13 VITALS — BP 125/73 | HR 84 | Wt 145.0 lb

## 2014-10-13 DIAGNOSIS — M20012 Mallet finger of left finger(s): Secondary | ICD-10-CM

## 2014-10-13 NOTE — Assessment & Plan Note (Signed)
Continue hand therapy  Return in 6 weeks

## 2014-10-13 NOTE — Progress Notes (Signed)
Bonnie Duke is a 69 y.o. female who presents to [facility]  today for follow-up left index finger mallet fracture. Patient was originally seen in June for a mallet finger fracture. She has had immobilization up until now. She has been to hand therapy several visits starting about a month ago. She brings with her note today stating that she has had remarkable improvement with hand therapy. She denies any pain of the DIP. She notes continued stiffness of the PIP and DIP of the index finger of the left hand.   No past medical history on file. No past surgical history on file. Social History  Substance Use Topics  . Smoking status: Never Smoker   . Smokeless tobacco: Not on file  . Alcohol Use: Not on file   family history is not on file.  ROS as above Medications: Current Outpatient Prescriptions  Medication Sig Dispense Refill  . citalopram (CELEXA) 20 MG tablet Take 20 mg by mouth daily.    Marland Kitchen levothyroxine (SYNTHROID, LEVOTHROID) 75 MCG tablet Take 75 mcg by mouth daily before breakfast.    . simvastatin (ZOCOR) 40 MG tablet Take 40 mg by mouth daily.     No current facility-administered medications for this visit.   Allergies  Allergen Reactions  . Aspirin   . Biaxin [Clarithromycin]   . Codeine   . Pneumovax [Pneumococcal Polysaccharide Vaccine]   . Sulfa Antibiotics      Exam:  BP 125/73 mmHg  Pulse 84  Wt 145 lb (65.772 kg) Gen: Well NAD Left hand index finger: Nontender PIP and DIP. Slight decreased flexion range of motion compared to other fingers of the PIP. Significant decreased range of motion of the DIP. Capillary refill and sensation intact distally. Minimal extensor lag present. Intact extension strength DIP  X-ray September 23 of finger reviewed  No results found for this or any previous visit (from the past 24 hour(s)). No results found.   Please see individual assessment and plan sections.

## 2014-10-19 ENCOUNTER — Encounter: Payer: Self-pay | Admitting: Sports Medicine

## 2014-10-19 ENCOUNTER — Ambulatory Visit (INDEPENDENT_AMBULATORY_CARE_PROVIDER_SITE_OTHER): Payer: Medicare Other | Admitting: Sports Medicine

## 2014-10-19 VITALS — BP 128/75 | HR 71 | Wt 144.0 lb

## 2014-10-19 DIAGNOSIS — M9261 Juvenile osteochondrosis of tarsus, right ankle: Secondary | ICD-10-CM

## 2014-10-19 DIAGNOSIS — M20012 Mallet finger of left finger(s): Secondary | ICD-10-CM | POA: Diagnosis not present

## 2014-10-19 NOTE — Assessment & Plan Note (Signed)
Injection into the left second DIP. She is 3 months post mallet injury, she has full strength to extension, and the main complaint is simply lack of range of motion. There is also widespread osteoarthritis visible on x-rays and ultrasound.

## 2014-10-19 NOTE — Progress Notes (Signed)
  Subjective:    CC: Finger and heel pain  HPI: Left second mallet finger: Improved significantly, is 3 months post injury, has a bit of pain at the distal interphalangeal joint but greatest issue is lack of regaining range of motion, she has been doing some physical therapy.  Right heel swelling: Known Haglund's deformity, painful, is unable to wear closed heel shoes.  Past medical history, Surgical history, Family history not pertinant except as noted below, Social history, Allergies, and medications have been entered into the medical record, reviewed, and no changes needed.   Review of Systems: No fevers, chills, night sweats, weight loss, chest pain, or shortness of breath.   Objective:    General: Well Developed, well nourished, and in no acute distress.  Neuro: Alert and oriented x3, extra-ocular muscles intact, sensation grossly intact.  HEENT: Normocephalic, atraumatic, pupils equal round reactive to light, neck supple, no masses, no lymphadenopathy, thyroid nonpalpable.  Skin: Warm and dry, no rashes. Cardiac: Regular rate and rhythm, no murmurs rubs or gallops, no lower extremity edema.  Respiratory: Clear to auscultation bilaterally. Not using accessory muscles, speaking in full sentences. Left hand: Minimal swelling at the second DIP, a bit of tenderness to palpation, there are widespread Heberden and Bouchard nodes. Right Ankle: Visible fullness over the posterior calcaneus consistent with Haglund's deformity Range of motion is full in all directions. Strength is 5/5 in all directions. Stable lateral and medial ligaments; squeeze test and kleiger test unremarkable; Talar dome nontender; No pain at base of 5th MT; No tenderness over cuboid; No tenderness over N spot or navicular prominence No tenderness on posterior aspects of lateral and medial malleolus No sign of peroneal tendon subluxations; Negative tarsal tunnel tinel's  Procedure: Real-time Ultrasound Guided  Injection of left second DIP Device: GE Logiq E  Verbal informed consent obtained.  Time-out conducted.  Noted no overlying erythema, induration, or other signs of local infection.  Skin prepped in a sterile fashion.  Local anesthesia: Topical Ethyl chloride.  With sterile technique and under real time ultrasound guidance:  0.5 mL kenalog 40, 0.5 mL lidocaine injected easily Completed without difficulty  Pain immediately resolved suggesting accurate placement of the medication.  Advised to call if fevers/chills, erythema, induration, drainage, or persistent bleeding.  Images permanently stored and available for review in the ultrasound unit.  Impression: Technically successful ultrasound guided injection.  Procedure: Real-time Ultrasound Guided Injection of right calcaneal bursa Device: GE Logiq E  Verbal informed consent obtained.  Time-out conducted.  Noted no overlying erythema, induration, or other signs of local infection.  Skin prepped in a sterile fashion.  Local anesthesia: Topical Ethyl chloride.  With sterile technique and under real time ultrasound guidance:  On ultrasound I did note a minimally distended bursa with some soft tissue swelling overlying the Haglund's deformity, this was injected with 0.5 mL kenalog 40, 0.5 mL lidocaine taking great care to avoid intra-Achilles injection. Completed without difficulty  Pain immediately resolved suggesting accurate placement of the medication.  Advised to call if fevers/chills, erythema, induration, drainage, or persistent bleeding.  Images permanently stored and available for review in the ultrasound unit.  Impression: Technically successful ultrasound guided injection.  Impression and Recommendations:

## 2014-10-19 NOTE — Assessment & Plan Note (Signed)
There is some swelling and calcaneal bursitis visible on ultrasound, injection as above. I did advise her that she would probably need to consider pump bump excision surgery.

## 2014-11-16 ENCOUNTER — Encounter: Payer: Self-pay | Admitting: Sports Medicine

## 2014-11-16 ENCOUNTER — Ambulatory Visit (INDEPENDENT_AMBULATORY_CARE_PROVIDER_SITE_OTHER): Payer: Medicare Other | Admitting: Sports Medicine

## 2014-11-16 VITALS — BP 124/60 | HR 74 | Wt 146.0 lb

## 2014-11-16 DIAGNOSIS — M20012 Mallet finger of left finger(s): Secondary | ICD-10-CM | POA: Diagnosis not present

## 2014-11-16 DIAGNOSIS — M9261 Juvenile osteochondrosis of tarsus, right ankle: Secondary | ICD-10-CM | POA: Diagnosis not present

## 2014-11-16 NOTE — Progress Notes (Signed)
  Subjective:    CC: follow-up  HPI: Bonnie Duke is a pleasant 69 year old female, we injected her right calcaneal bursitis and she returns completely pain-free. She also had a mallet finger injury months ago of her left index finger, she had some pain at the DIP, this is injected and pain is now completely resolved, she does still have a bit of lack of range of motion but hasn't been working hard on rehabilitation exercises.  Past medical history, Surgical history, Family history not pertinant except as noted below, Social history, Allergies, and medications have been entered into the medical record, reviewed, and no changes needed.   Review of Systems: No fevers, chills, night sweats, weight loss, chest pain, or shortness of breath.   Objective:    General: Well Developed, well nourished, and in no acute distress.  Neuro: Alert and oriented x3, extra-ocular muscles intact, sensation grossly intact.  HEENT: Normocephalic, atraumatic, pupils equal round reactive to light, neck supple, no masses, no lymphadenopathy, thyroid nonpalpable.  Skin: Warm and dry, no rashes. Cardiac: Regular rate and rhythm, no murmurs rubs or gallops, no lower extremity edema.  Respiratory: Clear to auscultation bilaterally. Not using accessory muscles, speaking in full sentences. Right Ankle: No visible erythema or swelling. Range of motion is full in all directions. Strength is 5/5 in all directions. Stable lateral and medial ligaments; squeeze test and kleiger test unremarkable; Talar dome nontender; No pain at base of 5th MT; No tenderness over cuboid; No tenderness over N spot or navicular prominence No tenderness on posterior aspects of lateral and medial malleolus No sign of peroneal tendon subluxations; Negative tarsal tunnel tinel's Able to walk 4 steps. Left hand: Still lacks some flexion at the distal interphalangeal joint of the second digit, no tenderness palpation, agrees to work harder on  rehabilitation stretching.  Impression and Recommendations:

## 2014-11-16 NOTE — Assessment & Plan Note (Signed)
Was responded well to calcaneal bursa injection.

## 2014-11-16 NOTE — Assessment & Plan Note (Signed)
Doing extremely well, good response to injection, excellent strength to extension and just needs to work on range of motion.

## 2014-11-24 ENCOUNTER — Ambulatory Visit: Payer: Medicare Other | Admitting: Family Medicine

## 2015-11-19 ENCOUNTER — Ambulatory Visit (INDEPENDENT_AMBULATORY_CARE_PROVIDER_SITE_OTHER): Payer: Medicare Other

## 2015-11-19 ENCOUNTER — Encounter: Payer: Self-pay | Admitting: Sports Medicine

## 2015-11-19 ENCOUNTER — Ambulatory Visit (INDEPENDENT_AMBULATORY_CARE_PROVIDER_SITE_OTHER): Payer: Medicare Other | Admitting: Sports Medicine

## 2015-11-19 DIAGNOSIS — M170 Bilateral primary osteoarthritis of knee: Secondary | ICD-10-CM | POA: Insufficient documentation

## 2015-11-19 DIAGNOSIS — M25561 Pain in right knee: Secondary | ICD-10-CM | POA: Diagnosis not present

## 2015-11-19 DIAGNOSIS — M1712 Unilateral primary osteoarthritis, left knee: Secondary | ICD-10-CM

## 2015-11-19 NOTE — Progress Notes (Signed)
   Subjective:    I'm seeing this patient as a consultation for:  Dr. Cindie Larocheobert Eberle  CC: Left knee pain  HPI: For a few weeks this pleasant 70 year old female has had increasing pain that she localizes in her left knee, medial joint line. She did have osteoarthritis and a degenerative meniscal tear in her right knee. Pain is now severe, persistent, she cannot take NSAIDs due to history of peptic ulcer disease. No mechanical symptoms, no trauma.  Past medical history:  Negative.  See flowsheet/record as well for more information.  Surgical history: Negative.  See flowsheet/record as well for more information.  Family history: Negative.  See flowsheet/record as well for more information.  Social history: Negative.  See flowsheet/record as well for more information.  Allergies, and medications have been entered into the medical record, reviewed, and no changes needed.   Review of Systems: No headache, visual changes, nausea, vomiting, diarrhea, constipation, dizziness, abdominal pain, skin rash, fevers, chills, night sweats, weight loss, swollen lymph nodes, body aches, joint swelling, muscle aches, chest pain, shortness of breath, mood changes, visual or auditory hallucinations.   Objective:   General: Well Developed, well nourished, and in no acute distress.  Neuro/Psych: Alert and oriented x3, extra-ocular muscles intact, able to move all 4 extremities, sensation grossly intact. Skin: Warm and dry, no rashes noted.  Respiratory: Not using accessory muscles, speaking in full sentences, trachea midline.  Cardiovascular: Pulses palpable, no extremity edema. Abdomen: Does not appear distended. Left Knee: Visibly swollen, minimal fluid wave, tenderness at the medial joint line ROM normal in flexion and extension and lower leg rotation. Ligaments with solid consistent endpoints including ACL, PCL, LCL, MCL. Negative Mcmurray's and provocative meniscal tests. Non painful patellar  compression. Patellar and quadriceps tendons unremarkable. Hamstring and quadriceps strength is normal.  Procedure: Real-time Ultrasound Guided Injection of left knee Device: GE Logiq E  Verbal informed consent obtained.  Time-out conducted.  Noted no overlying erythema, induration, or other signs of local infection.  Skin prepped in a sterile fashion.  Local anesthesia: Topical Ethyl chloride.  With sterile technique and under real time ultrasound guidance:  1 mL kenalog 40, 2 mL lidocaine, 2 mL Marcaine injected easily into the suprapatellar recess Completed without difficulty  Pain immediately resolved suggesting accurate placement of the medication.  Advised to call if fevers/chills, erythema, induration, drainage, or persistent bleeding.  Images permanently stored and available for review in the ultrasound unit.  Impression: Technically successful ultrasound guided injection.  Impression and Recommendations:   This case required medical decision making of moderate complexity.  Primary osteoarthritis of left knee Unable to take NSAIDs due to stomach ulcer disease. Injection, x-rays. Return to see me in one month.

## 2015-11-19 NOTE — Assessment & Plan Note (Signed)
Unable to take NSAIDs due to stomach ulcer disease. Injection, x-rays. Return to see me in one month.

## 2015-12-10 ENCOUNTER — Encounter: Payer: Self-pay | Admitting: Sports Medicine

## 2015-12-10 ENCOUNTER — Ambulatory Visit (INDEPENDENT_AMBULATORY_CARE_PROVIDER_SITE_OTHER): Payer: Medicare Other | Admitting: Sports Medicine

## 2015-12-10 ENCOUNTER — Ambulatory Visit (INDEPENDENT_AMBULATORY_CARE_PROVIDER_SITE_OTHER): Payer: Medicare Other

## 2015-12-10 DIAGNOSIS — S99821A Other specified injuries of right foot, initial encounter: Secondary | ICD-10-CM | POA: Diagnosis not present

## 2015-12-10 DIAGNOSIS — S99921A Unspecified injury of right foot, initial encounter: Secondary | ICD-10-CM | POA: Diagnosis not present

## 2015-12-10 DIAGNOSIS — M1712 Unilateral primary osteoarthritis, left knee: Secondary | ICD-10-CM

## 2015-12-10 DIAGNOSIS — X501XXA Overexertion from prolonged static or awkward postures, initial encounter: Secondary | ICD-10-CM | POA: Diagnosis not present

## 2015-12-10 MED ORDER — MELOXICAM 15 MG PO TABS: ORAL_TABLET | ORAL | 3 refills | Status: DC

## 2015-12-10 NOTE — Progress Notes (Signed)
  Subjective:    CC: Knee and foot injury  HPI: Left knee arthritis: Was doing well until she fell, now with a recurrence of pain. Did have 80% improvement in symptoms after injection, she did try some of her husbands meloxicam with good results, would like some of her own.  Right foot injury: With the fall as above she had an inversion injury to her right foot with immediate pain, swelling, bruising over the dorsum with pain specifically at the base of the fourth metatarsal.  Past medical history:  Negative.  See flowsheet/record as well for more information.  Surgical history: Negative.  See flowsheet/record as well for more information.  Family history: Negative.  See flowsheet/record as well for more information.  Social history: Negative.  See flowsheet/record as well for more information.  Allergies, and medications have been entered into the medical record, reviewed, and no changes needed.   Review of Systems: No fevers, chills, night sweats, weight loss, chest pain, or shortness of breath.   Objective:    General: Well Developed, well nourished, and in no acute distress.  Neuro: Alert and oriented x3, extra-ocular muscles intact, sensation grossly intact.  HEENT: Normocephalic, atraumatic, pupils equal round reactive to light, neck supple, no masses, no lymphadenopathy, thyroid nonpalpable.  Skin: Warm and dry, no rashes. Cardiac: Regular rate and rhythm, no murmurs rubs or gallops, no lower extremity edema.  Respiratory: Clear to auscultation bilaterally. Not using accessory muscles, speaking in full sentences. Right Foot: Visibly bruised and swollen Range of motion is full in all directions. Strength is 5/5 in all directions. No hallux valgus. No pes cavus or pes planus. No abnormal callus noted. No pain over the navicular prominence, or base of fifth metatarsal. No tenderness to palpation of the calcaneal insertion of plantar fascia. No pain at the Achilles insertion. No  pain over the calcaneal bursa. No pain of the retrocalcaneal bursa. Swelling and bruising as well as tenderness is at the base of the fourth metatarsal No hallux rigidus or limitus. No tenderness palpation over interphalangeal joints. No pain with compression of the metatarsal heads. Neurovascularly intact distally.  Impression and Recommendations:    Primary osteoarthritis of left knee 80% better after steroid injection until a recent fall. She took some of her husbands meloxicam and did not have any trouble despite old peptic ulcer disease. Calling in meloxicam for her, and we are going to get her approved for Visco supplementation, we can proceed with the injections if she doesn't respond to meloxicam.   Right foot injury 2 days ago, pain, swelling, bruising over the dorsum of the foot at the base of the fourth metatarsal, x-rays to evaluate for fracture.

## 2015-12-10 NOTE — Assessment & Plan Note (Signed)
2 days ago, pain, swelling, bruising over the dorsum of the foot at the base of the fourth metatarsal, x-rays to evaluate for fracture.

## 2015-12-10 NOTE — Assessment & Plan Note (Signed)
80% better after steroid injection until a recent fall. She took some of her husbands meloxicam and did not have any trouble despite old peptic ulcer disease. Calling in meloxicam for her, and we are going to get her approved for Visco supplementation, we can proceed with the injections if she doesn't respond to meloxicam.

## 2015-12-11 ENCOUNTER — Telehealth: Payer: Self-pay | Admitting: Family Medicine

## 2015-12-11 NOTE — Telephone Encounter (Signed)
She should be getting a call regarding the results, there were no fractures, it is simply a sprain.

## 2015-12-11 NOTE — Telephone Encounter (Signed)
I think this is supposed to go to T. He just recently saw her

## 2015-12-11 NOTE — Telephone Encounter (Signed)
Patient called and is wondering if you had time to go over her xray results? It was swollen bad lastnight and hurting a little. Call patient when you get a chance

## 2015-12-13 ENCOUNTER — Telehealth: Payer: Self-pay | Admitting: Sports Medicine

## 2015-12-13 NOTE — Telephone Encounter (Signed)
Submitted for approval on Orthovisc. Awaiting confirmation.  

## 2015-12-13 NOTE — Telephone Encounter (Signed)
-----   Message from Monica Bectonhomas J Thekkekandam, MD sent at 12/10/2015 11:41 AM EST ----- Left knee osteoarthritis, Orthovisc approval, failed steroid injection, NSAIDs. ___________________________________________ Ihor Austinhomas J. Benjamin Stainhekkekandam, M.D., ABFM., CAQSM. Primary Care and Sports Medicine [facility]  Adjunct Instructor of Family Medicine  [employer]

## 2015-12-17 ENCOUNTER — Ambulatory Visit: Payer: Medicare Other | Admitting: Sports Medicine

## 2015-12-25 ENCOUNTER — Telehealth: Payer: Self-pay

## 2015-12-25 DIAGNOSIS — G8929 Other chronic pain: Secondary | ICD-10-CM

## 2015-12-25 DIAGNOSIS — M25562 Pain in left knee: Principal | ICD-10-CM

## 2015-12-25 NOTE — Telephone Encounter (Signed)
Sure, MRI ordered.  Also how about tramadol.

## 2015-12-25 NOTE — Telephone Encounter (Signed)
Pt states knee is causing her some significant pain in the left knee and would like to know if she can go ahead and get the MRI. Also pt states meloxicam is starting to cause abdominal pain and would like to know if she can get something different for the pain.

## 2015-12-26 NOTE — Telephone Encounter (Signed)
Pt.notified

## 2015-12-27 ENCOUNTER — Ambulatory Visit (INDEPENDENT_AMBULATORY_CARE_PROVIDER_SITE_OTHER): Payer: Medicare Other

## 2015-12-27 DIAGNOSIS — G8929 Other chronic pain: Secondary | ICD-10-CM

## 2015-12-27 DIAGNOSIS — M23232 Derangement of other medial meniscus due to old tear or injury, left knee: Secondary | ICD-10-CM

## 2015-12-27 DIAGNOSIS — M1712 Unilateral primary osteoarthritis, left knee: Secondary | ICD-10-CM

## 2015-12-27 DIAGNOSIS — M25562 Pain in left knee: Principal | ICD-10-CM

## 2016-01-16 ENCOUNTER — Ambulatory Visit: Payer: Medicare Other | Admitting: Sports Medicine

## 2016-02-19 ENCOUNTER — Ambulatory Visit: Payer: Medicare Other | Admitting: Sports Medicine

## 2017-04-21 ENCOUNTER — Ambulatory Visit (INDEPENDENT_AMBULATORY_CARE_PROVIDER_SITE_OTHER): Payer: Medicare Other

## 2017-04-21 ENCOUNTER — Ambulatory Visit: Payer: Medicare Other | Admitting: Sports Medicine

## 2017-04-21 DIAGNOSIS — G8929 Other chronic pain: Secondary | ICD-10-CM

## 2017-04-21 DIAGNOSIS — M19011 Primary osteoarthritis, right shoulder: Secondary | ICD-10-CM | POA: Diagnosis not present

## 2017-04-21 DIAGNOSIS — M542 Cervicalgia: Secondary | ICD-10-CM | POA: Diagnosis not present

## 2017-04-21 DIAGNOSIS — M25511 Pain in right shoulder: Secondary | ICD-10-CM | POA: Diagnosis not present

## 2017-04-21 MED ORDER — PREDNISONE 50 MG PO TABS: ORAL_TABLET | ORAL | 0 refills | Status: DC

## 2017-04-21 NOTE — Assessment & Plan Note (Signed)
I think that this represents the great enigma of sports medicine, pain from the shoulder versus radicular pain from the neck. Cervical spine x-rays, shoulder x-rays. Prednisone, formal physical therapy for the neck and the shoulder. Return to see me in 4 weeks, it will probably declare itself at that point.

## 2017-04-21 NOTE — Progress Notes (Signed)
Subjective:    I'm seeing this patient as a consultation for:  Dr. Cindie Larocheobert Eberle, MD  CC: right shoulder pain  HPI: Bonnie Duke is a 72yo female that is presenting today with right shoulder pain that has progressively worsened over the last 9 months.  Pt endorses that pain does not radiate. Pt reports that she wakes up with shoulder pain and it worsens with activity.   Pt reports that she has attempted utilizing heat to help relieve some of the pain but it has not been helpful.Pt endorses that the pain is exquisite in her right shoulder  when reaching behind her left shoulder to wash her back.      I reviewed the past medical history, family history, social history, surgical history, and allergies today and no changes were needed.  Please see the problem list section below in epic for further details.  Past Medical History: No past medical history on file. Past Surgical History: No past surgical history on file. Social History: Social History   Socioeconomic History  . Marital status: Married    Spouse name: Not on file  . Number of children: Not on file  . Years of education: Not on file  . Highest education level: Not on file  Occupational History  . Not on file  Social Needs  . Financial resource strain: Not on file  . Food insecurity:    Worry: Not on file    Inability: Not on file  . Transportation needs:    Medical: Not on file    Non-medical: Not on file  Tobacco Use  . Smoking status: Never Smoker  Substance and Sexual Activity  . Alcohol use: Not on file  . Drug use: Not on file  . Sexual activity: Not on file  Lifestyle  . Physical activity:    Days per week: Not on file    Minutes per session: Not on file  . Stress: Not on file  Relationships  . Social connections:    Talks on phone: Not on file    Gets together: Not on file    Attends religious service: Not on file    Active member of club or organization: Not on file    Attends meetings of clubs  or organizations: Not on file    Relationship status: Not on file  Other Topics Concern  . Not on file  Social History Narrative  . Not on file   Family History: No family history on file. Allergies: Allergies  Allergen Reactions  . Aspirin   . Biaxin [Clarithromycin]   . Codeine   . Pneumovax [Pneumococcal Polysaccharide Vaccine]   . Sulfa Antibiotics    Medications: See med rec.  Review of Systems: No headache, visual changes, nausea, vomiting, diarrhea, constipation, dizziness, abdominal pain, skin rash, fevers, chills, night sweats, weight loss, swollen lymph nodes, body aches, joint swelling, muscle aches, chest pain, shortness of breath, mood changes, visual or auditory hallucinations.   Objective:   General: Well Developed, well nourished, and in no acute distress.  Neuro:  Extra-ocular muscles intact, able to move all 4 extremities, sensation grossly intact.  Deep tendon reflexes tested were normal. Psych: Alert and oriented, mood congruent with affect. ENT:  Ears and nose appear unremarkable.  Hearing grossly normal. Neck: Unremarkable overall appearance, trachea midline.  No visible thyroid enlargement. Eyes: Conjunctivae and lids appear unremarkable.  Pupils equal and round. Skin: Warm and dry, no rashes noted.  Cardiovascular: Pulses palpable, no extremity edema.  Right Shoulder: Inspection reveals no abnormalities, atrophy or asymmetry. Palpation is normal with no tenderness over AC joint or bicipital groove. ROM is full in all planes, but pinpoint tenderness is present just medial to the spine of the scapula during flexion Rotator cuff strength normal throughout.  No signs of impingement with negative Neer and Hawkin's tests, empty can sign. Speeds and Yergason's tests normal. No labral pathology noted with negative Obrien's, negative clunk and good stability. Normal scapular function observed. No painful arc and no drop arm sign. No apprehension sign, but  pain and some popping is elicited at the [facility] joint line while externally rotating an abducted arm    Impression and Recommendations:   This case required medical decision making of moderate complexity.  Assessment-R. Cervical Radiculopathy and R. GH  Arthritis  1. Further evaluation of pain pt articulated as chief complaint (pinpoint prescapular tenderness reproducible during physical exam maneuvers)- likely cervical in nature with X-Ray imaging of neck; 2. Further Evaluation of pain- most likely right shoulder GH arthritis -elicited during physical exam of shoulder with X-Ray imaging of shoulder.  Pt is proceeding conservatively and is prescribed a short burst of prednisone and counseled to engage in rehabilitative exercises nightly for maintenance of range of motion  in neck and shoulder and strength in shoulder. Pt is asked to follow up in 1 month to review x-ray imaging.    ___________________________________________ Ihor Austin. Benjamin Stain, M.D., ABFM., CAQSM. Primary Care and Sports Medicine [facility]  Adjunct Instructor of Family Medicine  [employer]

## 2017-05-21 ENCOUNTER — Ambulatory Visit: Payer: Medicare Other | Admitting: Sports Medicine

## 2017-05-21 ENCOUNTER — Encounter: Payer: Self-pay | Admitting: Sports Medicine

## 2017-05-21 DIAGNOSIS — M25511 Pain in right shoulder: Secondary | ICD-10-CM

## 2017-05-21 DIAGNOSIS — G8929 Other chronic pain: Secondary | ICD-10-CM

## 2017-05-21 NOTE — Progress Notes (Signed)
Subjective:    CC: Follow-up  HPI: Neck and shoulder pain: Resolved with prednisone, rehab exercises, formal physical therapy was too expensive.  I reviewed the past medical history, family history, social history, surgical history, and allergies today and no changes were needed.  Please see the problem list section below in epic for further details.  Past Medical History: No past medical history on file. Past Surgical History: No past surgical history on file. Social History: Social History   Socioeconomic History  . Marital status: Married    Spouse name: Not on file  . Number of children: Not on file  . Years of education: Not on file  . Highest education level: Not on file  Occupational History  . Not on file  Social Needs  . Financial resource strain: Not on file  . Food insecurity:    Worry: Not on file    Inability: Not on file  . Transportation needs:    Medical: Not on file    Non-medical: Not on file  Tobacco Use  . Smoking status: Never Smoker  . Smokeless tobacco: Never Used  Substance and Sexual Activity  . Alcohol use: Not on file  . Drug use: Not on file  . Sexual activity: Not on file  Lifestyle  . Physical activity:    Days per week: Not on file    Minutes per session: Not on file  . Stress: Not on file  Relationships  . Social connections:    Talks on phone: Not on file    Gets together: Not on file    Attends religious service: Not on file    Active member of club or organization: Not on file    Attends meetings of clubs or organizations: Not on file    Relationship status: Not on file  Other Topics Concern  . Not on file  Social History Narrative  . Not on file   Family History: No family history on file. Allergies: Allergies  Allergen Reactions  . Aspirin   . Biaxin [Clarithromycin]   . Codeine   . Pneumovax [Pneumococcal Polysaccharide Vaccine]   . Sulfa Antibiotics    Medications: See med rec.  Review of Systems: No  fevers, chills, night sweats, weight loss, chest pain, or shortness of breath.   Objective:    General: Well Developed, well nourished, and in no acute distress.  Neuro: Alert and oriented x3, extra-ocular muscles intact, sensation grossly intact.  HEENT: Normocephalic, atraumatic, pupils equal round reactive to light, neck supple, no masses, no lymphadenopathy, thyroid nonpalpable.  Skin: Warm and dry, no rashes. Cardiac: Regular rate and rhythm, no murmurs rubs or gallops, no lower extremity edema.  Respiratory: Clear to auscultation bilaterally. Not using accessory muscles, speaking in full sentences. Neck: Negative spurling's Full neck range of motion Grip strength and sensation normal in bilateral hands Strength good C4 to T1 distribution No sensory change to C4 to T1 Reflexes normal Right Shoulder: Inspection reveals no abnormalities, atrophy or asymmetry. Palpation is normal with no tenderness over AC joint or bicipital groove. ROM is full in all planes. Rotator cuff strength normal throughout. No signs of impingement with negative Neer and Hawkin's tests, empty can. Speeds and Yergason's tests normal. No labral pathology noted with negative Obrien's, negative crank, negative clunk, and good stability. Normal scapular function observed. No painful arc and no drop arm sign. No apprehension sign  Impression and Recommendations:    Chronic right shoulder pain Completely resolved after prednisone, she did have  a bit of periscapular radicular pain as well, also resolved. Adding neck and shoulder rehab exercises, formal physical therapy was too extensive. Return to see me as needed but if she has the recurrence of pain I am happy to do another burst of prednisone and have her restart her homework. Return as needed. ___________________________________________ Ihor Austin. Benjamin Stain, M.D., ABFM., CAQSM. Primary Care and Sports Medicine [facility]  [facility]  Adjunct Instructor of Family Medicine  [employer]

## 2017-05-21 NOTE — Assessment & Plan Note (Signed)
Completely resolved after prednisone, she did have a bit of periscapular radicular pain as well, also resolved. Adding neck and shoulder rehab exercises, formal physical therapy was too extensive. Return to see me as needed but if she has the recurrence of pain I am happy to do another burst of prednisone and have her restart her homework. Return as needed.

## 2017-06-14 IMAGING — CR DG FINGER INDEX 2+V*L*
3 series · 3 of 3 positions shown · non-contrast
Comparison: July 27, 2014

CLINICAL DATA: Evaluate left index finger for mallet deformity

EXAM:
LEFT INDEX FINGER 2+V

[finger ap]
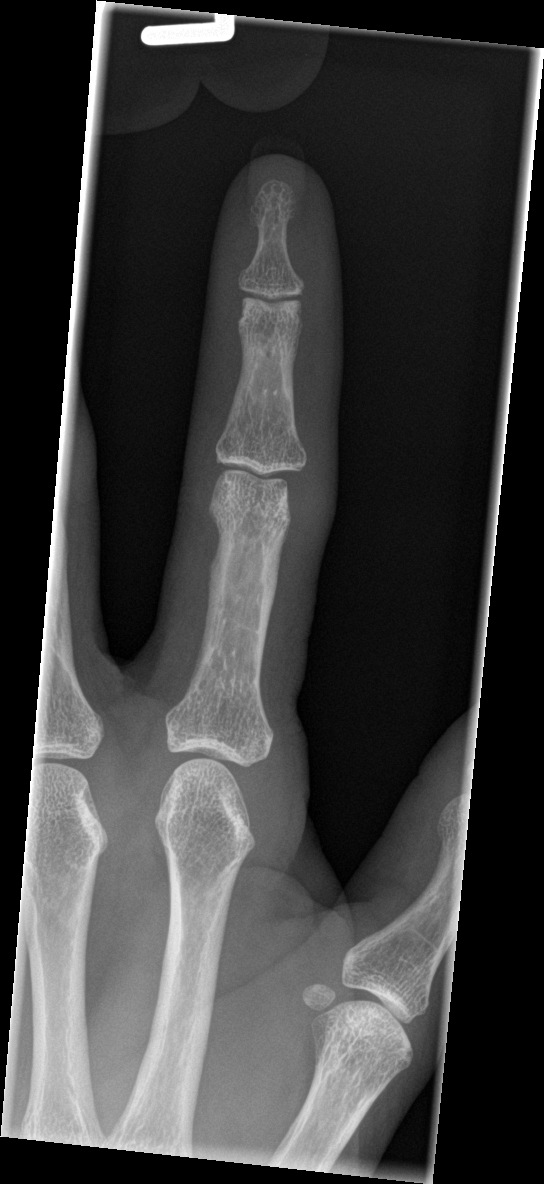

[finger obl]
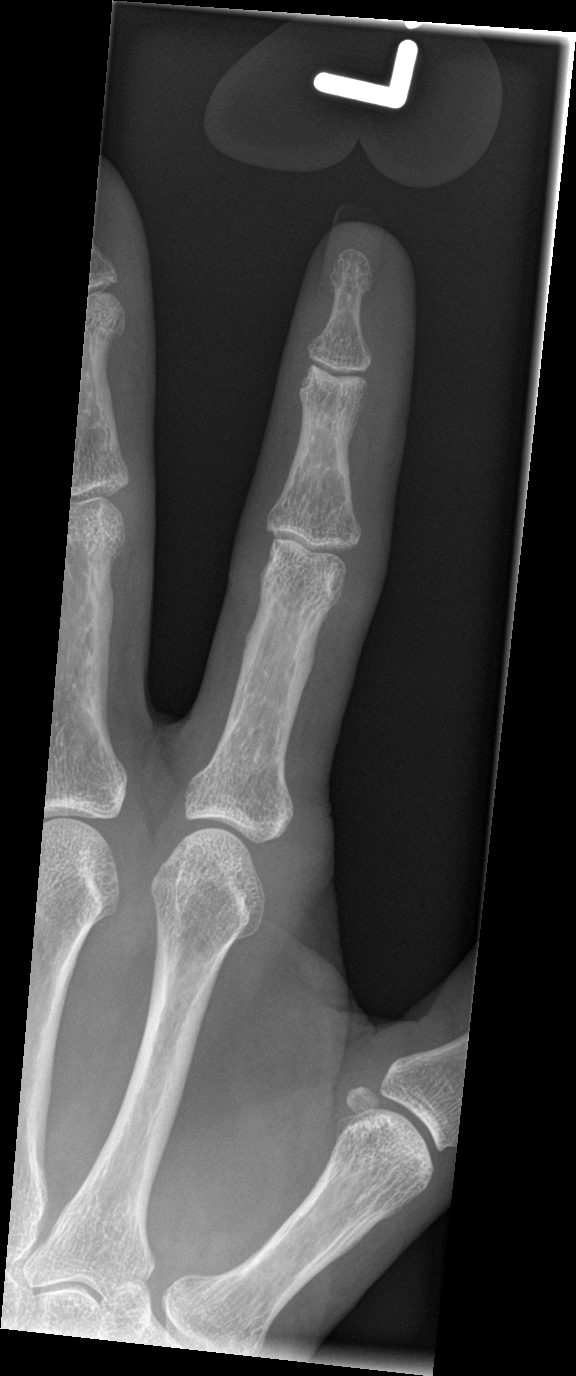

[finger lat]
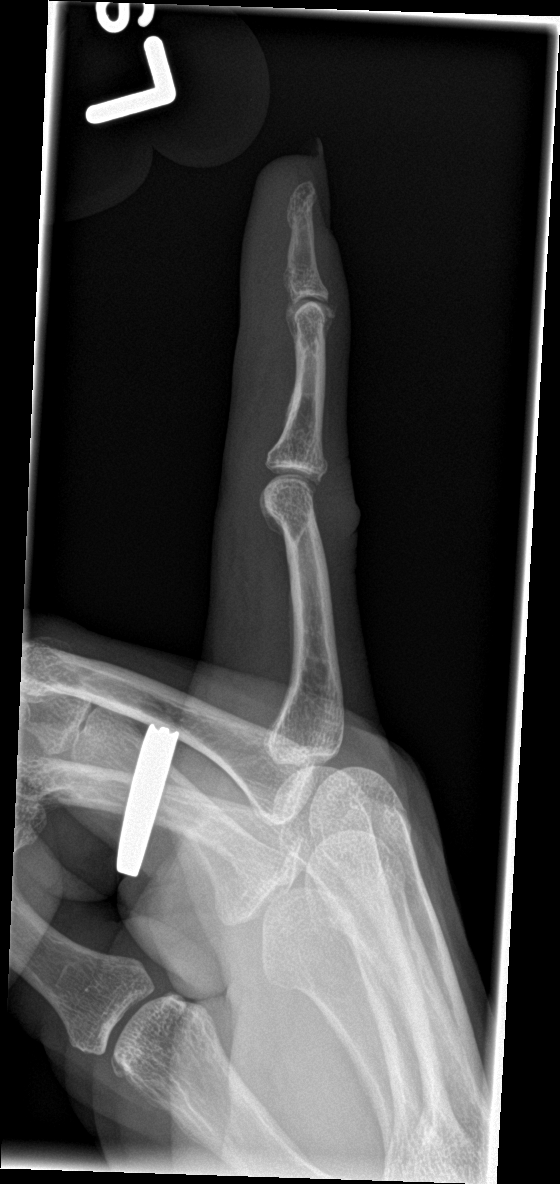

[3 of 3 positions shown; findings below may reference images not displayed]

FINDINGS: There is subtle bony density adjacent to the dorsal aspect of the
distal portion of the middle phalanx of the index finger. This
likely reflects the avulsion fracture fragment from the dorsal last
of packed of the base of the distal phalanx. There is mild overlying
soft tissue swelling. The PIP joint is unremarkable.
IMPRESSION: There is no significant change in the appearance of the DIP joint
and associated avulsion from the dorsal aspect of the base of the
distal phalanx.

## 2018-09-23 IMAGING — DX DG KNEE 1-2V*R*
4 series · 4 of 4 positions shown · non-contrast
Comparison: 06/20/2013 .

CLINICAL DATA: Pain after walking.

EXAM:
RIGHT KNEE - 1-2 VIEW

[tunnel]
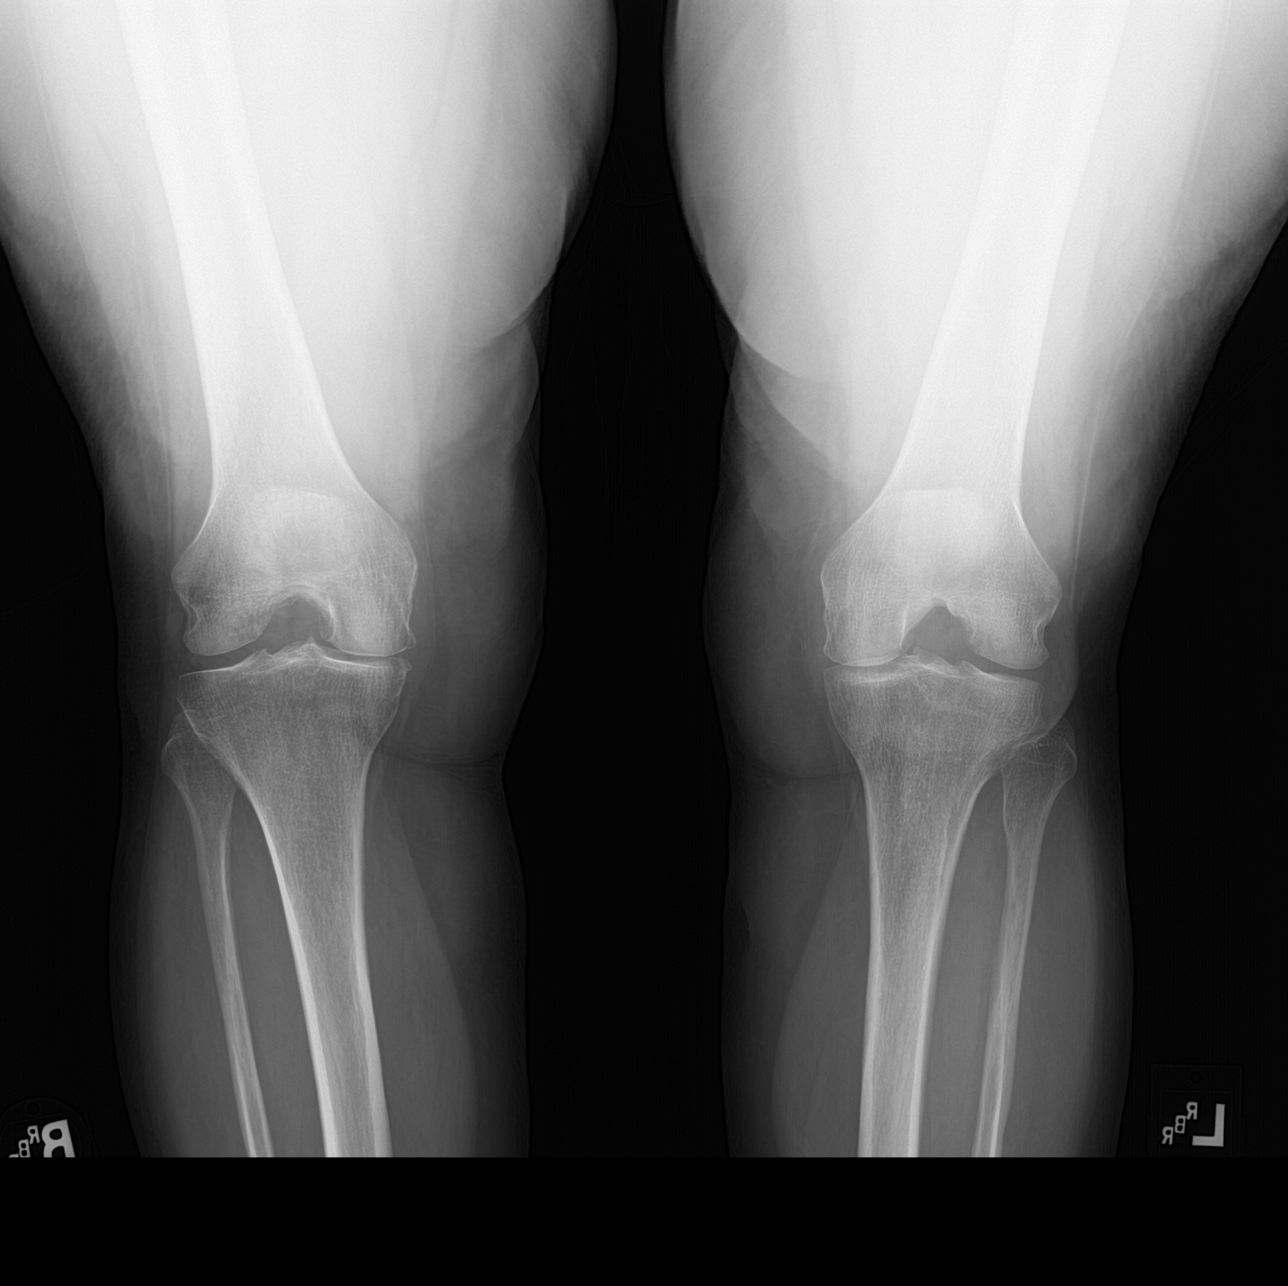

[knee lat]
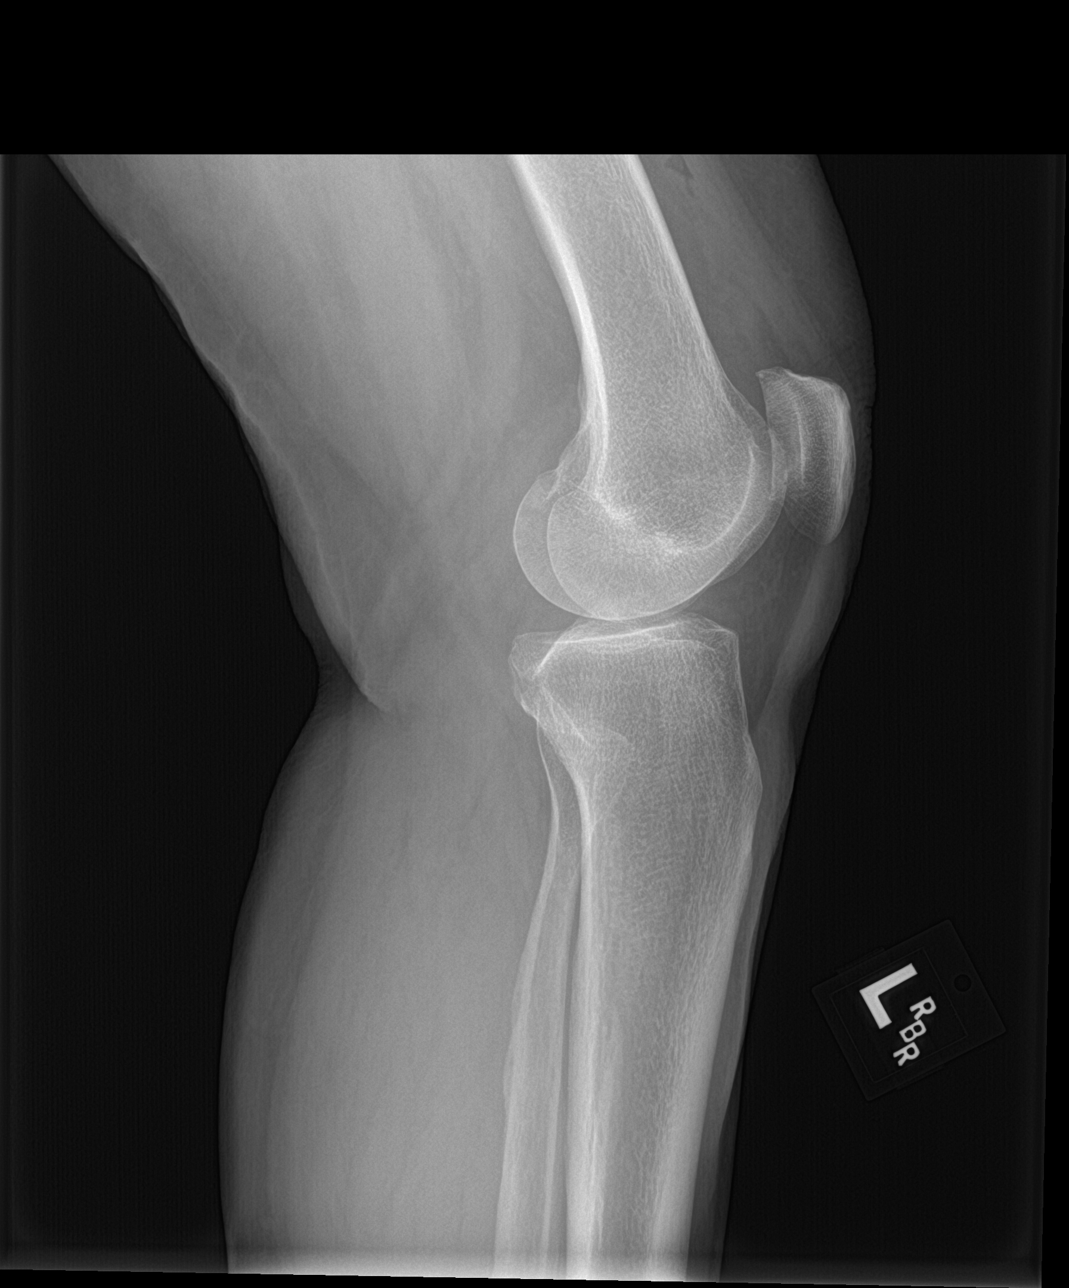

[knee sunrise]
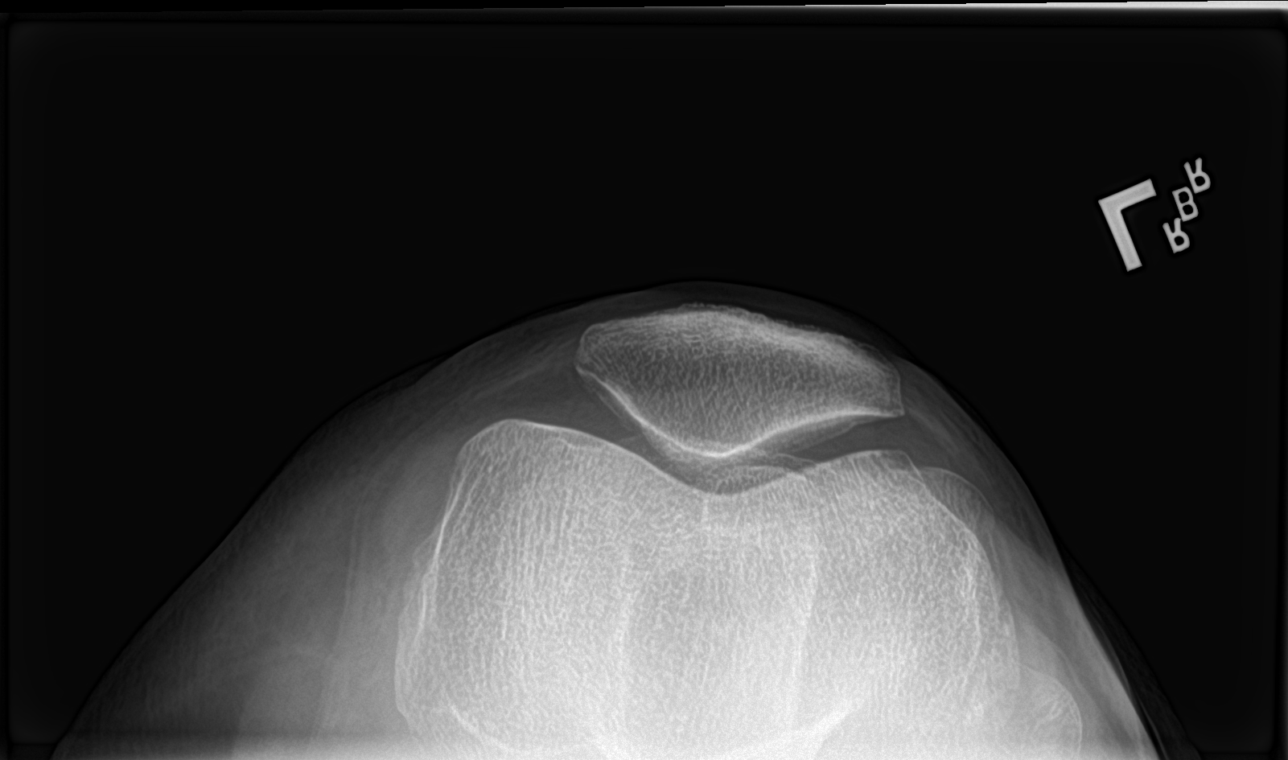

[knee ap bilat standing]
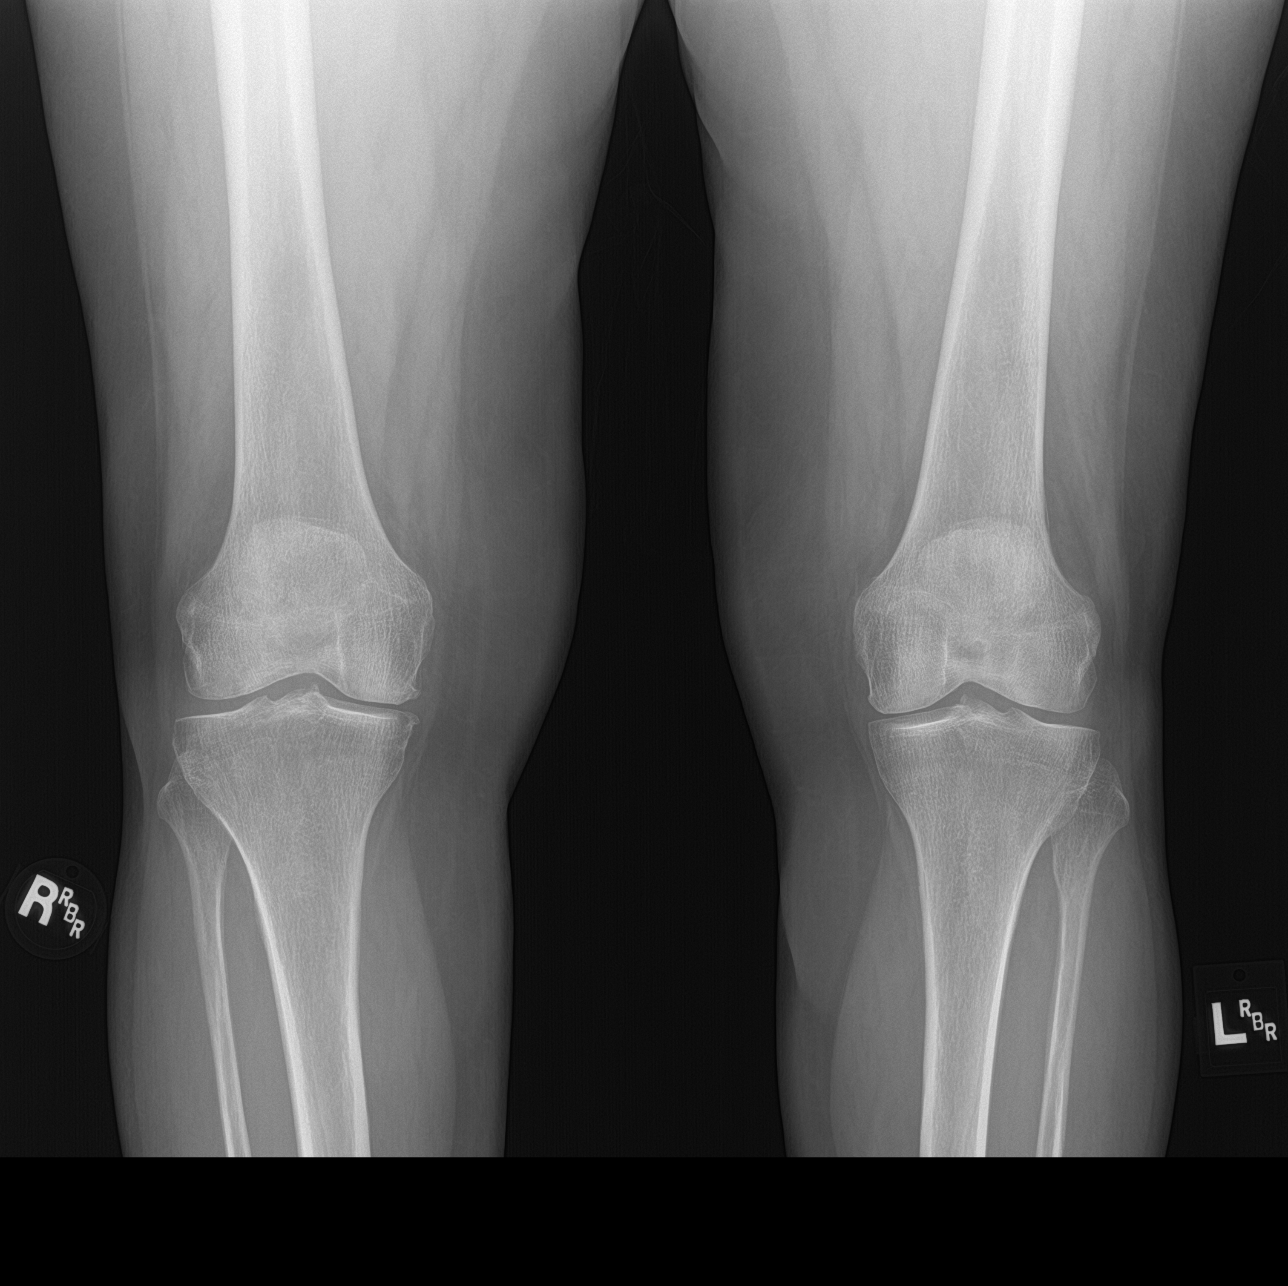

[4 of 4 positions shown; findings below may reference images not displayed]

FINDINGS: Mild tricompartment degenerative change present. No acute
abnormality identified. No evidence of fracture or dislocation.
IMPRESSION: Mild tricompartment degenerative change. No acute abnormality
identified.

## 2019-04-29 ENCOUNTER — Ambulatory Visit (INDEPENDENT_AMBULATORY_CARE_PROVIDER_SITE_OTHER): Payer: Medicare Other | Admitting: Sports Medicine

## 2019-04-29 ENCOUNTER — Ambulatory Visit (INDEPENDENT_AMBULATORY_CARE_PROVIDER_SITE_OTHER): Payer: Medicare Other

## 2019-04-29 ENCOUNTER — Other Ambulatory Visit: Payer: Self-pay

## 2019-04-29 ENCOUNTER — Encounter: Payer: Self-pay | Admitting: Sports Medicine

## 2019-04-29 DIAGNOSIS — M25511 Pain in right shoulder: Secondary | ICD-10-CM

## 2019-04-29 DIAGNOSIS — G8929 Other chronic pain: Secondary | ICD-10-CM | POA: Diagnosis not present

## 2019-04-29 MED ORDER — CELECOXIB 200 MG PO CAPS: ORAL_CAPSULE | ORAL | 2 refills | Status: DC

## 2019-04-29 NOTE — Assessment & Plan Note (Signed)
Bonnie Duke returns, she is a pleasant 74 year old female with multifactorial right shoulder pain, she likely has glenohumeral and rotator cuff sources of her discomfort. She has weakness to abduction consistent with supraspinatus injury. Pain with abduction and external rotation consistent with a glenohumeral injury. Updated x-rays, home rehabilitation exercises, Celebrex as other NSAIDs caused GI discomfort. Return to see me in 4 to 6 weeks, injection if no better.

## 2019-04-29 NOTE — Progress Notes (Signed)
    Procedures performed today:    None.  Independent interpretation of notes and tests performed by another provider:   None.  Brief History, Exam, Impression, and Recommendations:    Chronic right shoulder pain Bonnie Duke returns, she is a pleasant 74 year old female with multifactorial right shoulder pain, she likely has glenohumeral and rotator cuff sources of her discomfort. She has weakness to abduction consistent with supraspinatus injury. Pain with abduction and external rotation consistent with a glenohumeral injury. Updated x-rays, home rehabilitation exercises, Celebrex as other NSAIDs caused GI discomfort. Return to see me in 4 to 6 weeks, injection if no better.    ___________________________________________ Ihor Austin. Benjamin Stain, M.D., ABFM., CAQSM. Primary Care and Sports Medicine [facility]  Adjunct Instructor of Family Medicine  [employer]

## 2019-05-02 ENCOUNTER — Ambulatory Visit: Payer: Medicare Other | Admitting: Sports Medicine

## 2019-06-03 ENCOUNTER — Other Ambulatory Visit: Payer: Self-pay

## 2019-06-03 ENCOUNTER — Ambulatory Visit (INDEPENDENT_AMBULATORY_CARE_PROVIDER_SITE_OTHER): Payer: Medicare Other | Admitting: Sports Medicine

## 2019-06-03 DIAGNOSIS — M25511 Pain in right shoulder: Secondary | ICD-10-CM | POA: Diagnosis not present

## 2019-06-03 DIAGNOSIS — M170 Bilateral primary osteoarthritis of knee: Secondary | ICD-10-CM | POA: Diagnosis not present

## 2019-06-03 DIAGNOSIS — G8929 Other chronic pain: Secondary | ICD-10-CM | POA: Diagnosis not present

## 2019-06-03 NOTE — Assessment & Plan Note (Signed)
Bonnie Duke returns, she is a very pleasant 74 year old female with multifactorial right shoulder pain, glenohumeral and impingement pain generators. We treated her conservatively with Celebrex as other NSAIDs caused GI discomfort, I gave her some home rehab exercises and she returns today pain-free.

## 2019-06-03 NOTE — Assessment & Plan Note (Signed)
Bonnie Duke is having a recurrence of knee pain, she went to her grandsons graduation at [employer], she also has a daughter that is going to be working on her PhD in Brewing technologist. She did a lot of walking and is having pain at the medial joint line of her left knee. At this point she is having mild to moderate pain, we are going to give this a couple weeks of icing and conservative treatment before considering an injection. She can return to see me as needed for this.

## 2019-06-03 NOTE — Progress Notes (Signed)
    Procedures performed today:    None.  Independent interpretation of notes and tests performed by another provider:   I did personally review her x-rays of the shoulder, there is mild glenohumeral and acromioclavicular degenerative changes, knee x-rays were also personally reviewed which show moderate to severe medial compartment osteoarthritis.  Brief History, Exam, Impression, and Recommendations:    Chronic right shoulder pain Bonnie Duke returns, she is a very pleasant 74 year old female with multifactorial right shoulder pain, glenohumeral and impingement pain generators. We treated her conservatively with Celebrex as other NSAIDs caused GI discomfort, I gave her some home rehab exercises and she returns today pain-free.  Primary osteoarthritis of both knees Bonnie Duke is having a recurrence of knee pain, she went to her grandsons graduation at [employer], she also has a daughter that is going to be working on her PhD in Brewing technologist. She did a lot of walking and is having pain at the medial joint line of her left knee. At this point she is having mild to moderate pain, we are going to give this a couple weeks of icing and conservative treatment before considering an injection. She can return to see me as needed for this.    ___________________________________________ Ihor Austin. Benjamin Stain, M.D., ABFM., CAQSM. Primary Care and Sports Medicine [facility]  Adjunct Instructor of Family Medicine  [employer]

## 2020-01-01 ENCOUNTER — Other Ambulatory Visit: Payer: Self-pay | Admitting: Sports Medicine

## 2020-01-01 DIAGNOSIS — M25511 Pain in right shoulder: Secondary | ICD-10-CM

## 2020-03-27 DIAGNOSIS — F3342 Major depressive disorder, recurrent, in full remission: Secondary | ICD-10-CM | POA: Insufficient documentation

## 2021-02-04 ENCOUNTER — Ambulatory Visit: Payer: Medicare Other | Admitting: Sports Medicine

## 2021-03-28 ENCOUNTER — Ambulatory Visit (INDEPENDENT_AMBULATORY_CARE_PROVIDER_SITE_OTHER): Payer: Medicare Other

## 2021-03-28 ENCOUNTER — Other Ambulatory Visit: Payer: Self-pay

## 2021-03-28 ENCOUNTER — Ambulatory Visit (INDEPENDENT_AMBULATORY_CARE_PROVIDER_SITE_OTHER): Payer: Medicare Other | Admitting: Sports Medicine

## 2021-03-28 DIAGNOSIS — M65341 Trigger finger, right ring finger: Secondary | ICD-10-CM

## 2021-03-28 NOTE — Progress Notes (Signed)
? ? ?  Procedures performed today:   ? ?Procedure: Real-time Ultrasound Guided injection of the right fourth flexor tendon sheath ?Device: Samsung HS60  ?Verbal informed consent obtained.  ?Time-out conducted.  ?Noted no overlying erythema, induration, or other signs of local infection.  ?Skin prepped in a sterile fashion.  ?Local anesthesia: Topical Ethyl chloride.  ?With sterile technique and under real time ultrasound guidance: Noted flexor nodule, 0.5 cc kenalog 40, 0.5 cc lidocaine injected easily ?Completed without difficulty  ?Advised to call if fevers/chills, erythema, induration, drainage, or persistent bleeding.  ?Images permanently stored and available for review in PACS.  ?Impression: Technically successful ultrasound guided injection. ? ?Independent interpretation of notes and tests performed by another provider:  ? ?None. ? ?Brief History, Exam, Impression, and Recommendations:   ? ?Trigger finger, right ring finger ?Several months of pain volar right ring finger with triggering. ?Palpable flexor tendon nodule. ?Injected today, home conditioning given, return to see me in a month. ? ? ? ?___________________________________________ ?Ihor Austin. Benjamin Stain, M.D., ABFM., CAQSM. ?Primary Care and Sports Medicine ?[facility] ? ?Adjunct Instructor of Family Medicine  ?[employer] ?

## 2021-03-28 NOTE — Assessment & Plan Note (Signed)
Several months of pain volar right ring finger with triggering. ?Palpable flexor tendon nodule. ?Injected today, home conditioning given, return to see me in a month. ?

## 2021-04-25 ENCOUNTER — Ambulatory Visit: Payer: Medicare Other | Admitting: Sports Medicine

## 2021-04-29 ENCOUNTER — Ambulatory Visit: Payer: Medicare Other | Admitting: Sports Medicine

## 2021-10-25 ENCOUNTER — Encounter: Payer: Self-pay | Admitting: Family Medicine

## 2021-10-25 LAB — COMPREHENSIVE METABOLIC PANEL
Albumin: 4.5 (ref 3.5–5.0)
Calcium: 9.3 (ref 8.7–10.7)
Globulin: 2.2
eGFR: 89.1

## 2021-10-25 LAB — TSH: TSH: 2.01 (ref 0.41–5.90)

## 2021-10-25 LAB — BASIC METABOLIC PANEL
BUN: 17 (ref 4–21)
CO2: 28 — AB (ref 13–22)
Chloride: 104 (ref 99–108)
Creatinine: 0.7 (ref 0.5–1.1)
Glucose: 77
Potassium: 4.7 mEq/L (ref 3.5–5.1)
Sodium: 141 (ref 137–147)

## 2021-10-25 LAB — HEPATIC FUNCTION PANEL
ALT: 13 U/L (ref 7–35)
AST: 24 (ref 13–35)
Alkaline Phosphatase: 56 (ref 25–125)
Bilirubin, Total: 0.5

## 2021-10-25 LAB — CBC AND DIFFERENTIAL
HCT: 38 (ref 36–46)
Hemoglobin: 12.4 (ref 12.0–16.0)
Platelets: 253 10*3/uL (ref 150–400)
WBC: 6.1

## 2021-10-25 LAB — CBC: RBC: 3.94 (ref 3.87–5.11)

## 2021-10-25 LAB — LIPID PANEL
Cholesterol: 187 (ref 0–200)
HDL: 87 — AB (ref 35–70)
LDL Cholesterol: 90
Triglycerides: 52 (ref 40–160)

## 2022-02-19 ENCOUNTER — Ambulatory Visit (INDEPENDENT_AMBULATORY_CARE_PROVIDER_SITE_OTHER): Payer: Medicare Other | Admitting: Family Medicine

## 2022-02-19 ENCOUNTER — Encounter: Payer: Self-pay | Admitting: Family Medicine

## 2022-02-19 VITALS — BP 129/63 | HR 77 | Ht 60.0 in | Wt 136.0 lb

## 2022-02-19 DIAGNOSIS — K635 Polyp of colon: Secondary | ICD-10-CM

## 2022-02-19 DIAGNOSIS — E038 Other specified hypothyroidism: Secondary | ICD-10-CM

## 2022-02-19 DIAGNOSIS — F418 Other specified anxiety disorders: Secondary | ICD-10-CM | POA: Diagnosis not present

## 2022-02-19 DIAGNOSIS — E7849 Other hyperlipidemia: Secondary | ICD-10-CM

## 2022-02-19 NOTE — Assessment & Plan Note (Signed)
Last LDL at goal.  She has been on simvastatin for quite some time.  Continue current regimen.  Recheck again in October.  Continue to work on healthy diet and regular exercise.  She and her husband are considering joining [employer].

## 2022-02-19 NOTE — Patient Instructions (Signed)
Please schedule her for [facility] as well.

## 2022-02-19 NOTE — Progress Notes (Signed)
Established Patient Office Visit  Subjective   Patient ID: Bonnie Duke, female    DOB: 1945/07/10  Age: 77 y.o. MRN: [id number]  Chief Complaint  Patient presents with   Establish Care    HPI  She is here today to establish care there.  She is this is Bonnie Duke who have taken care of for years.  She is pretty healthy overall.  Her previous provider closed up shop and so she needed to find a new provider.  She has a history of Graves' disease and was actually treated with radioactive iodine and is now hypothyroid.  She has been on levothyroxine 75 mcg 6 days a week for years and has done well with it.  She also is currently on citalopram 20 mg daily.  She said that it was started after her mother passed away and she has never come off of it.  She said she is at a point in her life where she would like to try tapering down.  She no longer does mammograms.    Review of Systems  All other systems reviewed and are negative.     Objective:     BP 129/63   Pulse 77   Ht 5' (1.524 m)   Wt 136 lb (61.7 kg)   SpO2 99%   BMI 26.56 kg/m    Physical Exam Vitals and nursing note reviewed.  Constitutional:      Appearance: She is well-developed.  HENT:     Head: Normocephalic and atraumatic.  Neck:     Comments: No TM Cardiovascular:     Rate and Rhythm: Normal rate and regular rhythm.     Heart sounds: Normal heart sounds.  Pulmonary:     Effort: Pulmonary effort is normal.     Breath sounds: Normal breath sounds.  Musculoskeletal:     Cervical back: Neck supple. No tenderness.  Skin:    General: Skin is warm and dry.  Neurological:     Mental Status: She is alert and oriented to person, place, and time.  Psychiatric:        Behavior: Behavior normal.      No results found for any visits on 02/19/22.    The 10-year ASCVD risk score (Arnett DK, et al., 2019) is: 20.8%* (Cholesterol units were assumed)    Assessment & Plan:   Problem List Items  Addressed This Visit       Digestive   Colon polyp     Endocrine   Hypothyroid - Primary    Plan to recheck TSH in October.  Will get most recent labs abstracted it sounds like she has been very stable on her current regimen.  Currently taking 1 tab daily 6 days a week.        Other   Situational anxiety    We discussed cutting her citalopram in half.  It should be scored.  But if she has difficulty splitting the tabs and just let us know and we will send over a new prescription for 10 mg I would like her to go down to 10 for 1 to 2 months and if at that point she is still doing well then we can taper off completely.      Hyperlipemia    Last LDL at goal.  She has been on simvastatin for quite some time.  Continue current regimen.  Recheck again in October.  Continue to work on healthy diet and regular exercise.  She and her husband  are considering joining [employer].       Return in about 9 months (around 11/10/2022) for Wellness Exam.    Beatrice Lecher, MD

## 2022-02-19 NOTE — Assessment & Plan Note (Signed)
We discussed cutting her citalopram in half.  It should be scored.  But if she has difficulty splitting the tabs and just let us know and we will send over a new prescription for 10 mg I would like her to go down to 10 for 1 to 2 months and if at that point she is still doing well then we can taper off completely.

## 2022-02-19 NOTE — Assessment & Plan Note (Signed)
Plan to recheck TSH in October.  Will get most recent labs abstracted it sounds like she has been very stable on her current regimen.  Currently taking 1 tab daily 6 days a week.

## 2022-03-03 ENCOUNTER — Other Ambulatory Visit: Payer: Self-pay | Admitting: Family Medicine

## 2022-03-10 ENCOUNTER — Other Ambulatory Visit: Payer: Self-pay | Admitting: Family Medicine

## 2022-03-10 ENCOUNTER — Ambulatory Visit (INDEPENDENT_AMBULATORY_CARE_PROVIDER_SITE_OTHER): Payer: Medicare Other | Admitting: Physician Assistant

## 2022-03-10 ENCOUNTER — Encounter: Payer: Self-pay | Admitting: Physician Assistant

## 2022-03-10 VITALS — BP 129/58 | HR 78 | Ht 60.0 in | Wt 137.1 lb

## 2022-03-10 DIAGNOSIS — F329 Major depressive disorder, single episode, unspecified: Secondary | ICD-10-CM | POA: Insufficient documentation

## 2022-03-10 DIAGNOSIS — S81832A Puncture wound without foreign body, left lower leg, initial encounter: Secondary | ICD-10-CM | POA: Diagnosis not present

## 2022-03-10 DIAGNOSIS — W5501XA Bitten by cat, initial encounter: Secondary | ICD-10-CM

## 2022-03-10 DIAGNOSIS — N952 Postmenopausal atrophic vaginitis: Secondary | ICD-10-CM | POA: Insufficient documentation

## 2022-03-10 MED ORDER — AMOXICILLIN-POT CLAVULANATE 875-125 MG PO TABS: 1.0000 | ORAL_TABLET | Freq: Two times a day (BID) | ORAL | 0 refills | Status: DC

## 2022-03-10 NOTE — Progress Notes (Unsigned)
   Acute Office Visit  Subjective:     Patient ID: Bonnie Duke, female    DOB: February 28, 1945, 77 y.o.   MRN: [id number]  Chief Complaint  Patient presents with   Animal Bite    Cat bite left leg    HPI Patient is in today for ***  ROS      Objective:    BP (!) 129/58 (BP Location: Left Arm, Patient Position: Sitting, Cuff Size: Normal)   Pulse 78   Ht 5' (1.524 m)   Wt 137 lb 1.9 oz (62.2 kg)   SpO2 97%   BMI 26.78 kg/m  {Vitals History (Optional):23777}  Physical Exam  No results found for any visits on 03/10/22.      Assessment & Plan:   Problem List Items Addressed This Visit   None   No orders of the defined types were placed in this encounter.   No follow-ups on file.  Iran Planas, PA-C

## 2022-03-10 NOTE — Progress Notes (Unsigned)
   Acute Office Visit  Subjective:     Patient ID: Bonnie Duke, female    DOB: 03/07/1945, 77 y.o.   MRN: [id number]  Chief Complaint  Patient presents with   Animal Bite    Cat bite left leg    Animal Bite    Patient is in today for a cat bite on her lower left shin. She was bitten by a stray cat in her neighborhood around 95 AM this morning. Animal control was called and reported the cat was UTD on vaccines. She says the pain is a 5/10 and hurts to touch.   Review of Systems  Constitutional:  Negative for chills and fever.  All other systems reviewed and are negative.       Objective:    BP (!) 129/58 (BP Location: Left Arm, Patient Position: Sitting, Cuff Size: Normal)   Pulse 78   Ht 5' (1.524 m)   Wt 62.2 kg   SpO2 97%   BMI 26.78 kg/m  {Vitals History (Optional):23777}  Physical Exam Constitutional:      Appearance: Normal appearance.   Musculoskeletal: 1 cm puncture wound to left shin. Two 1 mm scratches on left shin. Tender to palpation with mild edema. Would appears clean with small amount of blood.    No results found for any visits on 03/10/22.      Assessment & Plan:   Problem List Items Addressed This Visit   None Visit Diagnoses     Cat bite, initial encounter    -  Primary       Meds ordered this encounter  Medications   amoxicillin-clavulanate (AUGMENTIN) 875-125 MG tablet    Sig: Take 1 tablet by mouth 2 (two) times daily.    Dispense:  20 tablet    Refill:  0    Order Specific Question:   Supervising Provider    Answer:   Hali Marry [[id number]]   Start Augmentin x 10 days  Keep wound covered and clean  Tetanus shot UTD 5 years ago Pt instructed to call if wound is not improving   No follow-ups on file.  [place] November, [place]

## 2022-03-10 NOTE — Patient Instructions (Signed)
Animal Bite, Adult Animal bites can be mild or serious. Small bites from house pets normally are mild. Bites from cats, strays, or wild animals can be serious. If a stray or wild animal bites you, you need to get medical help right away. You may also need a shot to prevent rabies infection. What increases the risk? Being near pets you do not know. Being near animals that are eating, sleeping, or caring for their babies. Being outside where small, wild animals move freely. What are the signs or symptoms? Pain. Bleeding. Swelling. Bruising. How is this treated? Treatment may include: Cleaning your wound. Rinsing out (flushing) your wound. This uses saline solution, which is made of salt and water. Putting a bandage on your wound. Closing your wound with stitches (sutures), staples, skin glue, or skin tape (adhesive strips). Antibiotic medicine. You may be given pills, cream, gel, or fluid through an IV. A tetanus shot. Rabies treatment, if the animal could have rabies. Surgery, if there is infection or damage that needs to be fixed. Follow these instructions at home: Medicines Take or apply over-the-counter and prescription medicines only as told by your doctor. If you were prescribed an antibiotic medicine, take or apply it as told by your doctor. Do not stop using it even if your wound gets better. Wound care  Follow instructions from your doctor about how to take care of your wound. Make sure you: Wash your hands with soap and water for at least 20 seconds before and after you change your bandage. If you cannot use soap and water, use hand sanitizer. Change your bandage. Leave stitches or skin glue in place for at least 2 weeks. Leave tape strips alone unless you are told to take them off. You may trim the edges of the tape strips if they curl up. Check your wound every day for signs of infection. Check for: More redness, swelling, or pain. More fluid or blood. Warmth. Pus or a  bad smell. General instructions  Raise (elevate) the injured area above the level of your heart while you are sitting or lying down. If told, put ice on the injured area. To do this: Put ice in a plastic bag. Place a towel between your skin and the bag. Leave the ice on for 20 minutes, 2-3 times per day. Take off the ice if your skin turns bright red. This is very important. If you cannot feel pain, heat, or cold, you have a greater risk of damage to the area. Keep all follow-up visits. Contact a doctor if: You have more redness, swelling, or pain around your wound. Your wound feels warm to the touch. You have a fever or chills. You have a general feeling of sickness (malaise). You feel like you may vomit. You vomit. You have pain that does not get better. Get help right away if: You have a red streak going away from your wound. You have any of these coming from your wound: Non-clear fluid. More blood. Pus or a bad smell. You have trouble moving your injured area. You lose feeling (have numbness) or feel tingling anywhere on your body. Summary Animal bites can be mild or serious. If a stray or wild animal bites you, you need to get medical help right away. Your doctor will look at the wound and may ask about how the animal bite happened. Treatment may include wound care, antibiotic medicine, a tetanus shot, and rabies treatment. This information is not intended to replace advice given to you  by your health care provider. Make sure you discuss any questions you have with your health care provider. Document Revised: 01/04/2021 Document Reviewed: 01/04/2021 Elsevier Patient Education  [place].

## 2022-03-11 ENCOUNTER — Encounter: Payer: Self-pay | Admitting: Physician Assistant

## 2022-03-12 ENCOUNTER — Ambulatory Visit
Admission: RE | Admit: 2022-03-12 | Discharge: 2022-03-12 | Disposition: A | Discharge status: Home / Self Care | Payer: Medicare Other | Source: Ambulatory Visit | Attending: Emergency Medicine | Admitting: Emergency Medicine

## 2022-03-12 VITALS — BP 138/80 | HR 74 | Temp 98.5°F | Resp 14 | Ht 60.0 in | Wt 137.1 lb

## 2022-03-12 DIAGNOSIS — Z23 Encounter for immunization: Secondary | ICD-10-CM | POA: Diagnosis not present

## 2022-03-12 DIAGNOSIS — T148XXA Other injury of unspecified body region, initial encounter: Secondary | ICD-10-CM | POA: Diagnosis not present

## 2022-03-12 MED ORDER — RABIES VACCINE, PCEC IM SUSR
1.0000 mL | Freq: Once | INTRAMUSCULAR | Status: AC
  Administered 2022-03-12: 1 mL via INTRAMUSCULAR

## 2022-03-12 MED ORDER — RABIES IMMUNE GLOBULIN 150 UNIT/ML IM INJ
20.0000 [IU]/kg | INJECTION | Freq: Once | INTRAMUSCULAR | Status: AC
  Administered 2022-03-12: 1275 [IU]

## 2022-03-12 MED ORDER — TETANUS-DIPHTH-ACELL PERTUSSIS 5-2.5-18.5 LF-MCG/0.5 IM SUSY
0.5000 mL | PREFILLED_SYRINGE | Freq: Once | INTRAMUSCULAR | Status: AC
  Administered 2022-03-12: 0.5 mL via INTRAMUSCULAR

## 2022-03-12 NOTE — ED Triage Notes (Signed)
Cat bite on 03/10/22 Pt was started on antibiotics at that time  No Tdap at that time - last on was 2019 Here today for [facility] & Rabies vaccine  Unknown owner of cat  Bite is to  RLE

## 2022-03-12 NOTE — Discharge Instructions (Addendum)
                                  RABIES VACCINE FOLLOW UP  Patient's Name: Bonnie Duke                     Original Order Date:03/12/2022  Medical Record Number: [id number]  ED Physician: No att. providers found Primary Diagnosis: Rabies Exposure       PCP: Hali Marry, MD  Patient Phone Number: (home) [phone] (home)    (cell)  Telephone Information:  Mobile [phone]    (work) There is no work phone number on file. Species of Animal: Cat   You have been seen in the Emergency Department for a possible rabies exposure. It's very important you return for the additional vaccine doses.  Please call the clinic listed below for hours of operation.   Clinic that will administer your rabies vaccines: [facility] - [address] [place], [place] [address]  [phone]  DAY 0:  03/12/2022      DAY 3:  03/15/2022       DAY 7:  03/19/2022     DAY 14:  03/26/2022         Please continue to monitor your wound for signs of worsening infection including fever, increased redness, swelling, tenderness or drainage from the wound.  Thank you for visiting urgent care today.

## 2022-03-12 NOTE — ED Provider Notes (Signed)
[facility]    CSN: [id number] Arrival date & time: 03/12/22  1247    HISTORY   Chief Complaint  Patient presents with   Animal Bite   HPI Bonnie Duke is a pleasant, 77 y.o. female who presents to urgent care today. Patient reports being bitten by a cat 2 days ago.  States the cat was a stray in the neighborhood, states she does not know to whom the cat belongs and does not know the cat's vaccination status.  Patient states she believes that her last Tdap was 5 years ago but is unsure.  Patient was seen by her 2 days ago where she was started on antibiotics.  Patient was not advised to consider post cat bite rabies prophylaxis.  Patient states she contacted the [facility] who advised her to come here for rabies prophylaxis.  The history is provided by the patient.   History reviewed. No pertinent past medical history. 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   Reactive depression (situational) 03/10/2022   Situational anxiety 02/19/2022   Colon polyp 02/19/2022   Trigger finger, right ring finger 03/28/2021   Major depressive disorder, recurrent, in full remission ([place]) 03/27/2020   Chronic right shoulder pain 04/21/2017   Right foot injury 12/10/2015   Primary osteoarthritis of both knees 11/19/2015   Mallet finger of left hand 07/27/2014   Haglund's deformity of right heel 05/09/2014   Plantar fascial fibromatosis 07/22/2013   Adult body mass index 26.0-26.9 01/17/2013   Adjustment disorder with depressed mood 01/26/2012   Graves disease 07/10/2011   Hyperlipemia 07/05/2009   Hypothyroid 06/15/2008   Past Surgical History:  Procedure Laterality Date   TUBAL LIGATION     OB History   No obstetric history on file.    Home Medications    Prior to Admission medications   Medication Sig Start Date End Date Taking? Authorizing Provider  amoxicillin-clavulanate (AUGMENTIN) 875-125 MG tablet Take 1  tablet by mouth 2 (two) times daily. 03/10/22   Breeback, Royetta Car, PA-C  celecoxib (CELEBREX) 200 MG capsule TAKE 1-2 CAPSULES BY MOUTH DAILY AS NEEDED FOR PAIN 01/01/20   Silverio Decamp, MD  Cholecalciferol (D3 PO) Take 1 tablet by mouth daily.    [provider]  citalopram (CELEXA) 20 MG tablet Take 20 mg by mouth daily.    [provider]  ELDERBERRY PO Take 1 tablet by mouth daily.    [provider]  levothyroxine (SYNTHROID) 75 MCG tablet TAKE 1 TABLET BY MOUTH ONCE DAILY 03/10/22   Hali Marry, MD  Multiple Vitamins-Minerals (MULTIVITAMIN ADULTS 50+ PO) Take 1 tablet by mouth daily.    [provider]  Probiotic Product (PROBIOTIC PO) Take 1 tablet by mouth daily.    [provider]  simvastatin (ZOCOR) 40 MG tablet TAKE ONE TABLET BY MOUTH AT BEDTIME 03/03/22   Hali Marry, MD    Family History Family History  Problem Relation Age of Onset   Leukemia Father    Social History Social History   Tobacco Use   Smoking status: Never   Smokeless tobacco: Never  Vaping Use   Vaping Use: Never used  Substance Use Topics   Alcohol use: Not Currently   Drug use: Never   Allergies   Aspirin, Biaxin [clarithromycin], Codeine, Pneumovax [pneumococcal polysaccharide vaccine], and Sulfa antibiotics  Review of Systems Review of Systems Pertinent findings revealed after performing a 14 point review  of systems has been noted in the history of present illness.  Physical Exam Vital Signs BP 138/80 (BP Location: Left Arm)   Pulse 74   Temp 98.5 F (36.9 C) (Oral)   Resp 14   Ht 5' (1.524 m)   Wt 137 lb 1.9 oz (62.2 kg)   SpO2 98%   BMI 26.78 kg/m   No data found.  Physical Exam Vitals and nursing note reviewed.  Constitutional:      General: She is not in acute distress.    Appearance: Normal appearance.  HENT:     Head: Normocephalic and atraumatic.  Eyes:     Pupils: Pupils are equal, round, and  reactive to light.  Cardiovascular:     Rate and Rhythm: Normal rate and regular rhythm.  Pulmonary:     Effort: Pulmonary effort is normal.     Breath sounds: Normal breath sounds.  Musculoskeletal:        General: Normal range of motion.     Cervical back: Normal range of motion and neck supple.  Skin:    General: Skin is warm and dry.     Findings: Lesion (There are 3 well-healing scratches on the left lower extremity ranging in size from 0.2 cm to 1 cm without drainage, surrounding erythema, swelling or tenderness to palpation) present.  Neurological:     General: No focal deficit present.     Mental Status: She is alert and oriented to person, place, and time. Mental status is at baseline.  Psychiatric:        Mood and Affect: Mood normal.        Behavior: Behavior normal.        Thought Content: Thought content normal.        Judgment: Judgment normal.     Visual Acuity Right Eye Distance:   Left Eye Distance:   Bilateral Distance:    Right Eye Near:   Left Eye Near:    Bilateral Near:     UC Couse / Diagnostics / Procedures:     Radiology No results found.  Procedures Procedures (including critical care time) EKG  Pending results:  Labs Reviewed - No data to display  Medications Ordered in UC: Medications  rabies vaccine (RABAVERT) injection 1 mL (has no administration in time range)  rabies immune globulin (HYPERRAB/KEDRAB) injection 1,275 Units (has no administration in time range)  Tdap (BOOSTRIX) injection 0.5 mL (0.5 mLs Intramuscular Given 03/12/22 1355)    UC Diagnoses / Final Clinical Impressions(s)   I have reviewed the triage vital signs and the nursing notes.  Pertinent labs & imaging results that were available during my care of the patient were reviewed by me and considered in my medical decision making (see chart for details).    Final diagnoses:  Need for rabies vaccination  Animal bite   Patient advised to complete her prescription  for Augmentin.  Patient provided with Gilberto Better and RabAvert during her visit today.  Patient provided with dates of follow-up rate of her vaccines and advised she is welcome to return here to have these done.  Patient advised to continue to monitor wounds for signs of infection.  Patient was also provided with a Boostrix however the time of the writing of this note, I now see that her last Tdap was in October 2019.  Conservative care recommended.  Return precautions advised.  Please see discharge instructions below for details of plan of care as provided to patient. ED Prescriptions  None    PDMP not reviewed this encounter.  Pending results:  Labs Reviewed - No data to display  Discharge Instructions:   Discharge Instructions                                         [place]  Patient's Name: Rebia Gochnour                     Original Order Date:03/12/2022  Medical Record Number: [id number]  ED Physician: No att. providers found Primary Diagnosis: Rabies Exposure       PCP: Hali Marry, MD  Patient Phone Number: (home) [phone] (home)    (cell)  Telephone Information:  Mobile [phone]    (work) There is no work phone number on file. Species of Animal: Cat   You have been seen in the Emergency Department for a possible rabies exposure. It's very important you return for the additional vaccine doses.  Please call the clinic listed below for hours of operation.   Clinic that will administer your rabies vaccines: [facility] - [address] [place],  [address]  [phone]  DAY 0:  03/12/2022      DAY 3:  03/15/2022       DAY 7:  03/19/2022     DAY 14:  03/26/2022         Please continue to monitor your wound for signs of worsening infection including fever, increased redness, swelling, tenderness or drainage from the wound.  Thank you for visiting urgent care today.      Disposition Upon  Discharge:  Condition: stable for discharge home  Patient presented with an acute illness with associated systemic symptoms and significant discomfort requiring urgent management. In my opinion, this is a condition that a prudent lay person (someone who possesses an average knowledge of health and medicine) may potentially expect to result in complications if not addressed urgently such as respiratory distress, impairment of bodily function or dysfunction of bodily organs.   Routine symptom specific, illness specific and/or disease specific instructions were discussed with the patient and/or caregiver at length.   As such, the patient has been evaluated and assessed, work-up was performed and treatment was provided in alignment with urgent care protocols and evidence based medicine.  Patient/parent/caregiver has been advised that the patient may require follow up for further testing and treatment if the symptoms continue in spite of treatment, as clinically indicated and appropriate.  Patient/parent/caregiver has been advised to return to the [facility] or PCP if no better; to PCP or the Emergency Department if new signs and symptoms develop, or if the current signs or symptoms continue to change or worsen for further workup, evaluation and treatment as clinically indicated and appropriate  The patient will follow up with their current PCP if and as advised. If the patient does not currently have a PCP we will assist them in obtaining one.   The patient may need specialty follow up if the symptoms continue, in spite of conservative treatment and management, for further workup, evaluation, consultation and treatment as clinically indicated and appropriate.  Patient/parent/caregiver verbalized understanding and agreement of plan as discussed.  All questions were addressed during visit.  Please see discharge instructions below for further details of plan.  This office note has been dictated using Conservator, museum/gallery.  Unfortunately, this method of dictation can sometimes lead to typographical or grammatical errors.  I apologize for your inconvenience in advance if this occurs.  Please do not hesitate to reach out to me if clarification is needed.      Lynden Oxford Scales, PA-C 03/14/22 1022

## 2022-03-15 ENCOUNTER — Ambulatory Visit
Admission: RE | Admit: 2022-03-15 | Discharge: 2022-03-15 | Disposition: A | Discharge status: Home / Self Care | Payer: Medicare Other | Source: Ambulatory Visit | Attending: Family Medicine | Admitting: Family Medicine

## 2022-03-15 VITALS — BP 126/68 | HR 83 | Temp 98.8°F | Resp 14

## 2022-03-15 DIAGNOSIS — Z203 Contact with and (suspected) exposure to rabies: Secondary | ICD-10-CM | POA: Diagnosis not present

## 2022-03-15 DIAGNOSIS — Z23 Encounter for immunization: Secondary | ICD-10-CM | POA: Diagnosis not present

## 2022-03-15 MED ORDER — RABIES VACCINE, PCEC IM SUSR
1.0000 mL | Freq: Once | INTRAMUSCULAR | Status: AC
  Administered 2022-03-15: 1 mL via INTRAMUSCULAR

## 2022-03-15 NOTE — ED Triage Notes (Signed)
Pt returns for 2nd in series of rabies vaccine Here with husband  2nd shot to left deltoid

## 2022-03-19 ENCOUNTER — Ambulatory Visit
Admission: RE | Admit: 2022-03-19 | Discharge: 2022-03-19 | Disposition: A | Discharge status: Home / Self Care | Payer: Medicare Other | Source: Ambulatory Visit | Attending: Family Medicine | Admitting: Family Medicine

## 2022-03-19 VITALS — BP 112/66 | HR 66 | Temp 98.1°F | Resp 14

## 2022-03-19 DIAGNOSIS — Z203 Contact with and (suspected) exposure to rabies: Secondary | ICD-10-CM | POA: Diagnosis not present

## 2022-03-19 DIAGNOSIS — Z23 Encounter for immunization: Secondary | ICD-10-CM

## 2022-03-19 MED ORDER — RABIES VACCINE, PCEC IM SUSR
1.0000 mL | Freq: Once | INTRAMUSCULAR | Status: AC
  Administered 2022-03-19: 1 mL via INTRAMUSCULAR

## 2022-03-19 NOTE — ED Triage Notes (Signed)
Pt  here for  3rd in series for rabies vaccine  Here w/ husband  3rd shot to right deltoid

## 2022-03-21 ENCOUNTER — Other Ambulatory Visit: Payer: Self-pay | Admitting: Family Medicine

## 2022-03-26 ENCOUNTER — Ambulatory Visit
Admission: RE | Admit: 2022-03-26 | Discharge: 2022-03-26 | Disposition: A | Discharge status: Home / Self Care | Payer: Medicare Other | Source: Ambulatory Visit | Attending: Emergency Medicine | Admitting: Emergency Medicine

## 2022-03-26 DIAGNOSIS — Z23 Encounter for immunization: Secondary | ICD-10-CM | POA: Diagnosis not present

## 2022-03-26 MED ORDER — RABIES VACCINE, PCEC IM SUSR
1.0000 mL | Freq: Once | INTRAMUSCULAR | Status: AC
  Administered 2022-03-26: 1 mL via INTRAMUSCULAR

## 2022-03-26 NOTE — ED Triage Notes (Signed)
Pt here for 4th and final rabies vaccine. No concerns or reactions to prior vaccines, rabavert given in right deltoid.

## 2022-06-25 ENCOUNTER — Other Ambulatory Visit (INDEPENDENT_AMBULATORY_CARE_PROVIDER_SITE_OTHER): Payer: Medicare Other

## 2022-06-25 ENCOUNTER — Ambulatory Visit (INDEPENDENT_AMBULATORY_CARE_PROVIDER_SITE_OTHER): Payer: Medicare Other | Admitting: Sports Medicine

## 2022-06-25 ENCOUNTER — Ambulatory Visit (INDEPENDENT_AMBULATORY_CARE_PROVIDER_SITE_OTHER): Payer: Medicare Other

## 2022-06-25 DIAGNOSIS — M65341 Trigger finger, right ring finger: Secondary | ICD-10-CM

## 2022-06-25 DIAGNOSIS — M545 Low back pain, unspecified: Secondary | ICD-10-CM | POA: Diagnosis not present

## 2022-06-25 DIAGNOSIS — M25571 Pain in right ankle and joints of right foot: Secondary | ICD-10-CM

## 2022-06-25 DIAGNOSIS — G8929 Other chronic pain: Secondary | ICD-10-CM | POA: Diagnosis not present

## 2022-06-25 MED ORDER — CYCLOBENZAPRINE HCL 10 MG PO TABS: ORAL_TABLET | ORAL | 0 refills | Status: DC

## 2022-06-25 NOTE — Progress Notes (Signed)
    Procedures performed today:    Procedure: Real-time Ultrasound Guided injection of the right fourth flexor tendon sheath Device: Samsung HS60  Verbal informed consent obtained.  Time-out conducted.  Noted no overlying erythema, induration, or other signs of local infection.  Skin prepped in a sterile fashion.  Local anesthesia: Topical Ethyl chloride.  With sterile technique and under real time ultrasound guidance: Noted flexor nodule and visible ganglion cyst/fluid collection deep to the flexor digitorum profundus, 0.5 cc lidocaine, 0.5 cc kenalog 40 injected easily.   Completed without difficulty  Advised to call if fevers/chills, erythema, induration, drainage, or persistent bleeding.  Images permanently stored and available for review in PACS.  Impression: Technically successful ultrasound guided injection.  Independent interpretation of notes and tests performed by another provider:   None.  Brief History, Exam, Impression, and Recommendations:    Trigger finger, right ring finger Pleasant 77 year old female, recurrence of right ring trigger finger, last injected March 2023, repeated today.  Chronic pain of right ankle [name] has also noted increasing pain right lateral ankle, her pain is directly over the fibular shaft, she has no tenderness behind the fibula over the peroneals, no tenderness at the sinus tarsi, but simply directly over the shaft, good motion, good strength. Suspect stress injury, she has a boot at home and she will wear this for the next 4 weeks. I would like x-rays today.   Acute right-sided low back pain without sciatica Lifted something heavy and immediately felt pain right mid thoracic erector spinae, tenderness to palpation, nothing radicular, we will give this a couple weeks and Flexeril per her request, if insufficient improvement at the 4-week follow-up this is likely more of a disc process. I would like some lumbar spine x-rays  today.    ____________________________________________ Bonnie Duke. Benjamin Stain, M.D., ABFM., CAQSM., AME. Primary Care and Sports Medicine [facility]  Adjunct Professor of Family Medicine  [place] [employer]  Restaurant manager, fast food

## 2022-06-25 NOTE — Assessment & Plan Note (Signed)
Bonnie Duke has also noted increasing pain right lateral ankle, her pain is directly over the fibular shaft, she has no tenderness behind the fibula over the peroneals, no tenderness at the sinus tarsi, but simply directly over the shaft, good motion, good strength. Suspect stress injury, she has a boot at home and she will wear this for the next 4 weeks. I would like x-rays today.

## 2022-06-25 NOTE — Assessment & Plan Note (Signed)
Lifted something heavy and immediately felt pain right mid thoracic erector spinae, tenderness to palpation, nothing radicular, we will give this a couple weeks and Flexeril per her request, if insufficient improvement at the 4-week follow-up this is likely more of a disc process. I would like some lumbar spine x-rays today.

## 2022-06-25 NOTE — Assessment & Plan Note (Signed)
Pleasant 77 year old female, recurrence of right ring trigger finger, last injected March 2023, repeated today.

## 2022-07-23 ENCOUNTER — Ambulatory Visit (INDEPENDENT_AMBULATORY_CARE_PROVIDER_SITE_OTHER): Payer: Medicare Other | Admitting: Sports Medicine

## 2022-07-23 DIAGNOSIS — M65341 Trigger finger, right ring finger: Secondary | ICD-10-CM

## 2022-07-23 DIAGNOSIS — G8929 Other chronic pain: Secondary | ICD-10-CM | POA: Diagnosis not present

## 2022-07-23 DIAGNOSIS — M25571 Pain in right ankle and joints of right foot: Secondary | ICD-10-CM | POA: Diagnosis not present

## 2022-07-23 DIAGNOSIS — M545 Low back pain, unspecified: Secondary | ICD-10-CM | POA: Diagnosis not present

## 2022-07-23 NOTE — Progress Notes (Signed)
    Procedures performed today:    None.  Independent interpretation of notes and tests performed by another provider:   None.  Brief History, Exam, Impression, and Recommendations:    Acute right-sided low back pain without sciatica I saw this pleasant 77 year old female about 4 weeks ago, she lifted something heavy and immediately felt pain right mid thoracic erector spinae musculature with tenderness to palpation, we got her some Flexeril, home PT, x-rays, she has improved. Return to see me as needed.  Chronic pain of right ankle Also with chronic right lateral ankle pain directly over the fibular shaft without tenderness behind the fibula at the peroneals, no pain at the sinus tarsi, good motion, good strength, we added a boot, she will wear this for little while and transition to an ankle sleeve and is now pain-free.  Trigger finger, right ring finger Recurrence of right ring trigger finger, injected a month ago, completely symptom-free now. Return to see me as needed for this.    ____________________________________________ Ihor Austin. Benjamin Stain, M.D., ABFM., CAQSM., AME. Primary Care and Sports Medicine [facility]  Adjunct Professor of Family Medicine  [place] [employer] [facility] [employer]  Restaurant manager, fast food

## 2022-07-23 NOTE — Assessment & Plan Note (Signed)
I saw this pleasant 77 year old female about 4 weeks ago, she lifted something heavy and immediately felt pain right mid thoracic erector spinae musculature with tenderness to palpation, we got her some Flexeril, home PT, x-rays, she has improved. Return to see me as needed.

## 2022-07-23 NOTE — Assessment & Plan Note (Signed)
Also with chronic right lateral ankle pain directly over the fibular shaft without tenderness behind the fibula at the peroneals, no pain at the sinus tarsi, good motion, good strength, we added a boot, she will wear this for little while and transition to an ankle sleeve and is now pain-free.

## 2022-07-23 NOTE — Assessment & Plan Note (Signed)
Recurrence of right ring trigger finger, injected a month ago, completely symptom-free now. Return to see me as needed for this.

## 2022-11-04 ENCOUNTER — Ambulatory Visit (INDEPENDENT_AMBULATORY_CARE_PROVIDER_SITE_OTHER): Payer: Medicare Other | Admitting: Family Medicine

## 2022-11-04 VITALS — BP 126/63 | HR 71 | Ht 60.0 in | Wt 136.1 lb

## 2022-11-04 DIAGNOSIS — Z0000 Encounter for general adult medical examination without abnormal findings: Secondary | ICD-10-CM | POA: Diagnosis not present

## 2022-11-04 DIAGNOSIS — Z780 Asymptomatic menopausal state: Secondary | ICD-10-CM | POA: Diagnosis not present

## 2022-11-04 NOTE — Progress Notes (Signed)
MEDICARE ANNUAL WELLNESS VISIT  11/04/2022  Subjective:  Bonnie Duke is a 77 y.o. female patient of Metheney, Barbarann Ehlers, MD who had a Medicare Annual Wellness Visit today. Carisha is Retired and lives with their spouse. she has one child. she reports that she is socially active and does interact with friends/family regularly. she is moderately physically active and enjoys adult coloring, gardening, and socializing.  Patient Care Team: Agapito Games, MD as PCP - General (Family Medicine) Monica Becton, MD as Consulting Physician (Sports Medicine)     11/04/2022    9:55 AM 07/22/2013    9:44 AM  Advanced Directives  Does Patient Have a Medical Advance Directive? Yes Patient does not have advance directive;Patient would not like information  Type of Advance Directive Living will   Does patient want to make changes to medical advance directive? No - Patient declined     Hospital Utilization Over the Past 12 Months: # of hospitalizations or ER visits: 0 # of surgeries: 0  Review of Systems    Patient reports that her overall health is unchanged when compared to last year.  Review of Systems: History obtained from chart review and the patient  All other systems negative.  Pain Assessment Pain : No/denies pain     Current Medications & Allergies (verified) Allergies as of 11/04/2022       Reactions   Aspirin Nausea Only   Biaxin [clarithromycin]    Codeine Nausea Only   Pneumovax [pneumococcal Polysaccharide Vaccine]    Sulfa Antibiotics Nausea Only        Medication List        Accurate as of November 04, 2022 10:18 AM. If you have any questions, ask your nurse or doctor.          STOP taking these medications    amoxicillin-clavulanate 875-125 MG tablet Commonly known as: AUGMENTIN   celecoxib 200 MG capsule Commonly known as: CELEBREX   cyclobenzaprine 10 MG tablet Commonly known as: FLEXERIL   ELDERBERRY PO       TAKE  these medications    citalopram 20 MG tablet Commonly known as: CELEXA Take one tablet (20 mg dose) by mouth daily.   clobetasol 0.05 % external solution Commonly known as: TEMOVATE Apply 1 Application topically 2 (two) times daily.   D3 PO Take 1 tablet by mouth daily.   levothyroxine 75 MCG tablet Commonly known as: SYNTHROID TAKE 1 TABLET BY MOUTH ONCE DAILY   MULTIVITAMIN ADULTS 50+ PO Take 1 tablet by mouth daily.   PROBIOTIC PO Take 1 tablet by mouth daily.   simvastatin 40 MG tablet Commonly known as: ZOCOR TAKE ONE TABLET BY MOUTH AT BEDTIME        History (reviewed): History reviewed. No pertinent past medical history. Past Surgical History:  Procedure Laterality Date   TUBAL LIGATION     Family History  Problem Relation Age of Onset   Leukemia Father    Social History   Socioeconomic History   Marital status: Married    Spouse name: John   Number of children: 1   Years of education: 11   Highest education level: 11th grade  Occupational History   Occupation: Retired  Tobacco Use   Smoking status: Never   Smokeless tobacco: Never  Vaping Use   Vaping status: Never Used  Substance and Sexual Activity   Alcohol use: Not Currently   Drug use: Never   Sexual activity: Not on file  Other Topics Concern  Not on file  Social History Narrative   She lives with her husband. She has one son. She enjoys  adult coloring, gardening, and socializing.   Social Determinants of Health   Financial Resource Strain: Low Risk  (11/04/2022)   Overall Financial Resource Strain (CARDIA)    Difficulty of Paying Living Expenses: Not hard at all  Food Insecurity: No Food Insecurity (11/04/2022)   Hunger Vital Sign    Worried About Running Out of Food in the Last Year: Never true    Ran Out of Food in the Last Year: Never true  Transportation Needs: No Transportation Needs (11/04/2022)   PRAPARE - Administrator, Civil Service (Medical): No     Lack of Transportation (Non-Medical): No  Physical Activity: Insufficiently Active (11/04/2022)   Exercise Vital Sign    Days of Exercise per Week: 4 days    Minutes of Exercise per Session: 30 min  Stress: No Stress Concern Present (11/04/2022)   [employer] of Occupational Health - Occupational Stress Questionnaire    Feeling of Stress : Not at all  Social Connections: Moderately Isolated (11/04/2022)   Social Connection and Isolation Panel [NHANES]    Frequency of Communication with Friends and Family: More than three times a week    Frequency of Social Gatherings with Friends and Family: Twice a week    Attends Religious Services: Never    Database administrator or Organizations: No    Attends Banker Meetings: Never    Marital Status: Married    Activities of Daily Living    11/04/2022    9:59 AM  In your present state of health, do you have any difficulty performing the following activities:  Hearing? 0  Vision? 0  Difficulty concentrating or making decisions? 0  Walking or climbing stairs? 0  Dressing or bathing? 0  Doing errands, shopping? 0  Preparing Food and eating ? N  Using the Toilet? N  In the past six months, have you accidently leaked urine? N  Do you have problems with loss of bowel control? N  Managing your Medications? N  Managing your Finances? N  Housekeeping or managing your Housekeeping? N    Patient Education/Literacy How often do you need to have someone help you when you read instructions, pamphlets, or other written materials from your doctor or pharmacy?: 1 - Never What is the last grade level you completed in school?: 11th grade  Exercise    Diet Patient reports consuming 2 meals a day and 1 snack(s) a day Patient reports that her primary diet is: Regular Patient reports that she does have regular access to food.   Depression Screen    11/04/2022    9:56 AM 03/10/2022    4:21 PM 02/19/2022   11:17 AM  PHQ 2/9 Scores   PHQ - 2 Score 0 0 0     Fall Risk    11/04/2022    9:55 AM 03/10/2022    4:21 PM 02/19/2022   11:17 AM  Fall Risk   Falls in the past year? 0  0  Number falls in past yr: 0 0 0  Injury with Fall? 0 0 0  Risk for fall due to : No Fall Risks No Fall Risks No Fall Risks  Follow up Falls evaluation completed Falls evaluation completed Falls evaluation completed     Objective:   BP 126/63 (BP Location: Left Arm, Patient Position: Sitting, Cuff Size: Normal)   Pulse 71  Ht 5' (1.524 m)   Wt 136 lb 1.3 oz (61.7 kg)   SpO2 99%   BMI 26.58 kg/m   Last Weight  Most recent update: 11/04/2022  9:51 AM    Weight  61.7 kg (136 lb 1.3 oz)             Body mass index is 26.58 kg/m.  Hearing/Vision  Molley did not have difficulty with hearing/understanding during the face-to-face interview Rayna did not have difficulty with her vision during the face-to-face interview Reports that she has had a formal eye exam by an eye care professional within the past year Reports that she has not had a formal hearing evaluation within the past year  Cognitive Function:    11/04/2022   10:04 AM  6CIT Screen  What Year? 0 points  What month? 0 points  What time? 0 points  Count back from 20 0 points  Months in reverse 0 points  Repeat phrase 2 points  Total Score 2 points    Normal Cognitive Function Screening: Yes (Normal:0-7, Significant for Dysfunction: >8)  Immunization & Health Maintenance Record Immunization History  Administered Date(s) Administered   Influenza, High Dose Seasonal PF 10/31/2013, 11/08/2016, 12/16/2018   Influenza, Quadrivalent, Recombinant, Inj, Pf 12/16/2018   Influenza,inj,Quad PF,6-35 Mos 11/05/2017   Influenza,inj,quad, With Preservative 12/16/2018   Pneumococcal Polysaccharide-23 07/08/2010   Rabies, IM 03/12/2022, 03/15/2022, 03/19/2022, 03/26/2022   Tdap 06/14/2007, 10/26/2017, 03/12/2022   Zoster, Live 03/10/2012    Health Maintenance  Topic  Date Due   COVID-19 Vaccine (1 - 2023-24 season) 11/20/2022 (Originally 09/14/2022)   Zoster Vaccines- Shingrix (1 of 2) 02/04/2023 (Originally 01/06/1995)   Pneumonia Vaccine 38+ Years old (2 of 2 - PCV) 02/20/2023 (Originally 07/08/2011)   INFLUENZA VACCINE  04/13/2023 (Originally 08/14/2022)   DEXA SCAN  11/04/2023 (Originally 10/12/2020)   Hepatitis C Screening  11/04/2023 (Originally 01/06/1964)   Medicare Annual Wellness (AWV)  11/04/2023   DTaP/Tdap/Td (4 - Td or Tdap) 03/12/2032   HPV VACCINES  Aged Out       Assessment  This is a routine wellness examination for [employer].  Health Maintenance: Due or Overdue There are no preventive care reminders to display for this patient.   Loel Dubonnet does not need a referral for Community Assistance: Care Management:   no Social Work:    no Prescription Assistance:  no Nutrition/Diabetes Education:  no   Plan:  Personalized Goals  Goals Addressed               This Visit's Progress     Patient Stated (pt-stated)        Patient stated that she would like to maintain her healthy active lifestyle.       Personalized Health Maintenance & Screening Recommendations  Pneumococcal vaccine  Influenza vaccine Bone densitometry screening Shingles vaccine  Patient declined the vaccines.   Lung Cancer Screening Recommended: no (Low Dose CT Chest recommended if Age 75-80 years, 20 pack-year currently smoking OR have quit w/in past 15 years) Hepatitis C Screening recommended: yes HIV Screening recommended: no  Advanced Directives: Written information was not given per the patient's request.  Referrals & Orders Orders Placed This Encounter  Procedures   DEXAScan    Follow-up Plan Follow-up with Agapito Games, MD as planned Medicare wellness visit in one year.  AVS printed and given to the patient.   I have personally reviewed and noted the following in the patient's chart:   Medical and social  history Use of alcohol, tobacco or illicit drugs  Current medications and supplements Functional ability and status Nutritional status Physical activity Advanced directives List of other physicians Hospitalizations, surgeries, and ER visits in previous 12 months Vitals Screenings to include cognitive, depression, and falls Referrals and appointments  In addition, I have reviewed and discussed with patient certain preventive protocols, quality metrics, and best practice recommendations. A written personalized care plan for preventive services as well as general preventive health recommendations were provided to patient.     Modesto Charon, RN BSN  11/04/2022

## 2022-11-04 NOTE — Patient Instructions (Signed)
MEDICARE ANNUAL WELLNESS VISIT Health Maintenance Summary and Written Plan of Care  Ms. Bonnie Duke ,  Thank you for allowing me to perform your Medicare Annual Wellness Visit and for your ongoing commitment to your health.   Health Maintenance & Immunization History Health Maintenance  Topic Date Due   COVID-19 Vaccine (1 - 2023-24 season) 11/20/2022 (Originally 09/14/2022)   Zoster Vaccines- Shingrix (1 of 2) 02/04/2023 (Originally 01/06/1995)   Pneumonia Vaccine 3+ Years old (2 of 2 - PCV) 02/20/2023 (Originally 07/08/2011)   INFLUENZA VACCINE  04/13/2023 (Originally 08/14/2022)   DEXA SCAN  11/04/2023 (Originally 10/12/2020)   Hepatitis C Screening  11/04/2023 (Originally 01/06/1964)   Medicare Annual Wellness (AWV)  11/04/2023   DTaP/Tdap/Td (4 - Td or Tdap) 03/12/2032   HPV VACCINES  Aged Out   Immunization History  Administered Date(s) Administered   Influenza, High Dose Seasonal PF 10/31/2013, 11/08/2016, 12/16/2018   Influenza, Quadrivalent, Recombinant, Inj, Pf 12/16/2018   Influenza,inj,Quad PF,6-35 Mos 11/05/2017   Influenza,inj,quad, With Preservative 12/16/2018   Pneumococcal Polysaccharide-23 07/08/2010   Rabies, IM 03/12/2022, 03/15/2022, 03/19/2022, 03/26/2022   Tdap 06/14/2007, 10/26/2017, 03/12/2022   Zoster, Live 03/10/2012    These are the patient goals that we discussed:  Goals Addressed               This Visit's Progress     Patient Stated (pt-stated)        Patient stated that she would like to maintain her healthy active lifestyle.         This is a list of Health Maintenance Items that are overdue or due now: Pneumococcal vaccine  Influenza vaccine Bone densitometry screening Shingles vaccine  Patient declined the vaccines.     Orders/Referrals Placed Today: Orders Placed This Encounter  Procedures   DEXAScan    Standing Status:   Future    Standing Expiration Date:   11/04/2023    Scheduling Instructions:     Please call patient to  schedule.    Order Specific Question:   Reason for exam:    Answer:   post menopausal    Order Specific Question:   Preferred imaging location?    Answer:   [facility]   (Contact our referral department at [phone] if you have not spoken with someone about your referral appointment within the next 5 days)    Follow-up Plan Follow-up with Agapito Games, MD as planned Medicare wellness visit in one year.  AVS printed and given to the patient.      Health Maintenance, Female Adopting a healthy lifestyle and getting preventive care are important in promoting health and wellness. Ask your health care provider about: The right schedule for you to have regular tests and exams. Things you can do on your own to prevent diseases and keep yourself healthy. What should I know about diet, weight, and exercise? Eat a healthy diet  Eat a diet that includes plenty of vegetables, fruits, low-fat dairy products, and lean protein. Do not eat a lot of foods that are high in solid fats, added sugars, or sodium. Maintain a healthy weight Body mass index (BMI) is used to identify weight problems. It estimates body fat based on height and weight. Your health care provider can help determine your BMI and help you achieve or maintain a healthy weight. Get regular exercise Get regular exercise. This is one of the most important things you can do for your health. Most adults should: Exercise for at least 150 minutes each  week. The exercise should increase your heart rate and make you sweat (moderate-intensity exercise). Do strengthening exercises at least twice a week. This is in addition to the moderate-intensity exercise. Spend less time sitting. Even light physical activity can be beneficial. Watch cholesterol and blood lipids Have your blood tested for lipids and cholesterol at 78 years of age, then have this test every 5 years. Have your cholesterol levels checked more often  if: Your lipid or cholesterol levels are high. You are older than 78 years of age. You are at high risk for heart disease. What should I know about cancer screening? Depending on your health history and family history, you may need to have cancer screening at various ages. This may include screening for: Breast cancer. Cervical cancer. Colorectal cancer. Skin cancer. Lung cancer. What should I know about heart disease, diabetes, and high blood pressure? Blood pressure and heart disease High blood pressure causes heart disease and increases the risk of stroke. This is more likely to develop in people who have high blood pressure readings or are overweight. Have your blood pressure checked: Every 3-5 years if you are 5-15 years of age. Every year if you are 74 years old or older. Diabetes Have regular diabetes screenings. This checks your fasting blood sugar level. Have the screening done: Once every three years after age 64 if you are at a normal weight and have a low risk for diabetes. More often and at a younger age if you are overweight or have a high risk for diabetes. What should I know about preventing infection? Hepatitis B If you have a higher risk for hepatitis B, you should be screened for this virus. Talk with your health care provider to find out if you are at risk for hepatitis B infection. Hepatitis C Testing is recommended for: Everyone born from [birth year] through 1966. Anyone with known risk factors for hepatitis C. Sexually transmitted infections (STIs) Get screened for STIs, including gonorrhea and chlamydia, if: You are sexually active and are younger than 78 years of age. You are older than 78 years of age and your health care provider tells you that you are at risk for this type of infection. Your sexual activity has changed since you were last screened, and you are at increased risk for chlamydia or gonorrhea. Ask your health care provider if you are at risk. Ask  your health care provider about whether you are at high risk for HIV. Your health care provider may recommend a prescription medicine to help prevent HIV infection. If you choose to take medicine to prevent HIV, you should first get tested for HIV. You should then be tested every 3 months for as long as you are taking the medicine. Pregnancy If you are about to stop having your period (premenopausal) and you may become pregnant, seek counseling before you get pregnant. Take 400 to 800 micrograms (mcg) of folic acid every day if you become pregnant. Ask for birth control (contraception) if you want to prevent pregnancy. Osteoporosis and menopause Osteoporosis is a disease in which the bones lose minerals and strength with aging. This can result in bone fractures. If you are 41 years old or older, or if you are at risk for osteoporosis and fractures, ask your health care provider if you should: Be screened for bone loss. Take a calcium or vitamin D supplement to lower your risk of fractures. Be given hormone replacement therapy (HRT) to treat symptoms of menopause. Follow these instructions at home:  Alcohol use Do not drink alcohol if: Your health care provider tells you not to drink. You are pregnant, may be pregnant, or are planning to become pregnant. If you drink alcohol: Limit how much you have to: 0-1 drink a day. Know how much alcohol is in your drink. In the [place] one drink equals one 12 oz bottle of beer (355 mL), one 5 oz glass of wine (148 mL), or one 1 oz glass of hard liquor (44 mL). Lifestyle Do not use any products that contain nicotine or tobacco. These products include cigarettes, chewing tobacco, and vaping devices, such as e-cigarettes. If you need help quitting, ask your health care provider. Do not use street drugs. Do not share needles. Ask your health care provider for help if you need support or information about quitting drugs. General instructions Schedule regular  health, dental, and eye exams. Stay current with your vaccines. Tell your health care provider if: You often feel depressed. You have ever been abused or do not feel safe at home. Summary Adopting a healthy lifestyle and getting preventive care are important in promoting health and wellness. Follow your health care provider's instructions about healthy diet, exercising, and getting tested or screened for diseases. Follow your health care provider's instructions on monitoring your cholesterol and blood pressure. This information is not intended to replace advice given to you by your health care provider. Make sure you discuss any questions you have with your health care provider. Document Revised: 05/21/2020 Document Reviewed: 05/21/2020 Elsevier Patient Education  2024 [employer].

## 2022-11-12 ENCOUNTER — Ambulatory Visit (INDEPENDENT_AMBULATORY_CARE_PROVIDER_SITE_OTHER): Payer: Medicare Other | Admitting: Family Medicine

## 2022-11-12 ENCOUNTER — Encounter: Payer: Self-pay | Admitting: Family Medicine

## 2022-11-12 VITALS — BP 133/54 | HR 81 | Ht 60.0 in | Wt 135.0 lb

## 2022-11-12 DIAGNOSIS — Z0000 Encounter for general adult medical examination without abnormal findings: Secondary | ICD-10-CM | POA: Diagnosis not present

## 2022-11-12 DIAGNOSIS — E7849 Other hyperlipidemia: Secondary | ICD-10-CM

## 2022-11-12 DIAGNOSIS — E038 Other specified hypothyroidism: Secondary | ICD-10-CM

## 2022-11-12 DIAGNOSIS — F3342 Major depressive disorder, recurrent, in full remission: Secondary | ICD-10-CM

## 2022-11-12 NOTE — Progress Notes (Signed)
Complete physical exam  Patient: Bonnie Duke   DOB: [birth year]   77 y.o. Female  MRN: [id number]  Subjective:    Chief Complaint  Patient presents with   Annual Exam    Bonnie Duke is a 77 y.o. female who presents today for a complete physical exam. She reports consuming a general diet. The patient does not participate in regular exercise at present. She generally feels well. She reports sleeping fairly well. She does not have additional problems to discuss today.    Most recent fall risk assessment:    11/04/2022    9:55 AM  Fall Risk   Falls in the past year? 0  Number falls in past yr: 0  Injury with Fall? 0  Risk for fall due to : No Fall Risks  Follow up Falls evaluation completed     Most recent depression screenings:    11/12/2022    9:51 AM 11/04/2022    9:56 AM  PHQ 2/9 Scores  PHQ - 2 Score 0 0  PHQ- 9 Score 0         Patient Care Team: Agapito Games, MD as PCP - General (Family Medicine) Monica Becton, MD as Consulting Physician (Sports Medicine)   Outpatient Medications Prior to Visit  Medication Sig   Cholecalciferol (D3 PO) Take 1 tablet by mouth daily.   citalopram (CELEXA) 20 MG tablet Take one tablet (20 mg dose) by mouth daily.   clobetasol (TEMOVATE) 0.05 % external solution Apply 1 Application topically 2 (two) times daily.   levothyroxine (SYNTHROID) 75 MCG tablet TAKE 1 TABLET BY MOUTH ONCE DAILY   Multiple Vitamins-Minerals (MULTIVITAMIN ADULTS 50+ PO) Take 1 tablet by mouth daily.   Probiotic Product (PROBIOTIC PO) Take 1 tablet by mouth daily.   simvastatin (ZOCOR) 40 MG tablet TAKE ONE TABLET BY MOUTH AT BEDTIME   No facility-administered medications prior to visit.    ROS        Objective:     BP (!) 133/54   Pulse 81   Ht 5' (1.524 m)   Wt 135 lb (61.2 kg)   SpO2 98%   BMI 26.37 kg/m    Physical Exam Constitutional:      Appearance: Normal appearance.  HENT:     Head: Normocephalic  and atraumatic.     Right Ear: Tympanic membrane, ear canal and external ear normal.     Left Ear: Tympanic membrane, ear canal and external ear normal.     Nose: Nose normal.     Mouth/Throat:     Pharynx: Oropharynx is clear.  Eyes:     Extraocular Movements: Extraocular movements intact.     Conjunctiva/sclera: Conjunctivae normal.     Pupils: Pupils are equal, round, and reactive to light.  Neck:     Thyroid: No thyromegaly.  Cardiovascular:     Rate and Rhythm: Normal rate and regular rhythm.  Pulmonary:     Effort: Pulmonary effort is normal.     Breath sounds: Normal breath sounds.  Abdominal:     General: Bowel sounds are normal.     Palpations: Abdomen is soft.     Tenderness: There is no abdominal tenderness.  Musculoskeletal:        General: No swelling.     Cervical back: Neck supple.  Skin:    General: Skin is warm and dry.  Neurological:     Mental Status: She is oriented to person, place, and time.  Psychiatric:  Mood and Affect: Mood normal.        Behavior: Behavior normal.      No results found for any visits on 11/12/22.     Assessment & Plan:    Routine Health Maintenance and Physical Exam  Immunization History  Administered Date(s) Administered   Influenza, High Dose Seasonal PF 10/31/2013, 11/08/2016, 12/16/2018   Influenza, Quadrivalent, Recombinant, Inj, Pf 12/16/2018   Influenza,inj,Quad PF,6-35 Mos 11/05/2017   Influenza,inj,quad, With Preservative 12/16/2018   Pneumococcal Polysaccharide-23 07/08/2010   Rabies, IM 03/12/2022, 03/15/2022, 03/19/2022, 03/26/2022   Tdap 06/14/2007, 10/26/2017, 03/12/2022   Zoster, Live 03/10/2012    Health Maintenance  Topic Date Due   COVID-19 Vaccine (1 - 2023-24 season) 11/20/2022 (Originally 09/14/2022)   Zoster Vaccines- Shingrix (1 of 2) 02/04/2023 (Originally 01/06/1995)   Pneumonia Vaccine 70+ Years old (2 of 2 - PCV) 02/20/2023 (Originally 07/08/2011)   INFLUENZA VACCINE  04/13/2023  (Originally 08/14/2022)   DEXA SCAN  11/04/2023 (Originally 10/12/2020)   Hepatitis C Screening  11/04/2023 (Originally 01/06/1964)   Medicare Annual Wellness (AWV)  11/04/2023   DTaP/Tdap/Td (4 - Td or Tdap) 03/12/2032   HPV VACCINES  Aged Out    Discussed health benefits of physical activity, and encouraged her to engage in regular exercise appropriate for her age and condition.  Problem List Items Addressed This Visit       Endocrine   Hypothyroid   Relevant Orders   TSH   CMP14+EGFR   Lipid panel   CBC     Other   Major depressive disorder, recurrent, in full remission (HCC)   Hyperlipemia   Relevant Orders   TSH   CMP14+EGFR   Lipid panel   CBC   Other Visit Diagnoses     Wellness examination    -  Primary     Keep up a regular exercise program and make sure you are eating a healthy diet Try to eat 4 servings of dairy a day, or if you are lactose intolerant take a calcium with vitamin D daily.  Your vaccines are up to date.   Return in about 6 months (around 05/13/2023) for Mood.     Nani Gasser, MD

## 2022-11-13 LAB — CMP14+EGFR
ALT: 12 [IU]/L (ref 0–32)
AST: 24 [IU]/L (ref 0–40)
Albumin: 4.6 g/dL (ref 3.8–4.8)
Alkaline Phosphatase: 70 [IU]/L (ref 44–121)
BUN/Creatinine Ratio: 17 (ref 12–28)
BUN: 14 mg/dL (ref 8–27)
Bilirubin Total: 0.3 mg/dL (ref 0.0–1.2)
CO2: 22 mmol/L (ref 20–29)
Calcium: 9.5 mg/dL (ref 8.7–10.3)
Chloride: 104 mmol/L (ref 96–106)
Creatinine, Ser: 0.81 mg/dL (ref 0.57–1.00)
Globulin, Total: 2.6 g/dL (ref 1.5–4.5)
Glucose: 78 mg/dL (ref 70–99)
Potassium: 4.4 mmol/L (ref 3.5–5.2)
Sodium: 142 mmol/L (ref 134–144)
Total Protein: 7.2 g/dL (ref 6.0–8.5)
eGFR: 75 mL/min/{1.73_m2} (ref >59)

## 2022-11-13 LAB — CBC
Hematocrit: 36 % (ref 34.0–46.6)
Hemoglobin: 11.6 g/dL (ref 11.1–15.9)
MCH: 31.8 pg (ref 26.6–33.0)
MCHC: 32.2 g/dL (ref 31.5–35.7)
MCV: 99 fL — ABNORMAL HIGH (ref 79–97)
Platelets: 284 10*3/uL (ref 150–450)
RBC: 3.65 x10E6/uL — ABNORMAL LOW (ref 3.77–5.28)
RDW: 12 % (ref 11.7–15.4)
WBC: 6 10*3/uL (ref 3.4–10.8)

## 2022-11-13 LAB — LIPID PANEL
Chol/HDL Ratio: 2.1 ratio (ref 0.0–4.4)
Cholesterol, Total: 189 mg/dL (ref 100–199)
HDL: 89 mg/dL (ref >39)
LDL Chol Calc (NIH): 89 mg/dL (ref 0–99)
Triglycerides: 61 mg/dL (ref 0–149)
VLDL Cholesterol Cal: 11 mg/dL (ref 5–40)

## 2022-11-13 LAB — TSH: TSH: 4.58 u[IU]/mL — ABNORMAL HIGH (ref 0.450–4.500)

## 2022-11-14 NOTE — Progress Notes (Signed)
Hi Rhett, thyroid level is a little elevated at 4.5.  You run out of medication recently or missed any doses recently besides maybe just 1.  If you did not then we may need to make a slight adjustment to your regimen and then plan to recheck again in 6 weeks.  Blood count overall is okay though the size of your red blood cells are little bit large so work on a call the lab to see if we can add a B12 and folic acid level.  Your metabolic panel and cholesterol look good.  Please call the lab and add a B12 and folate level.

## 2022-11-17 ENCOUNTER — Telehealth: Payer: Self-pay | Admitting: Family Medicine

## 2022-11-17 NOTE — Telephone Encounter (Signed)
Pt called stating that she is taking the thyroid pill 6 days instead of 7 so pt is wondering if she should bump it up to 7 days instead of 6?  Please advise pt by calling her. She is not familiar with My Chart responding.

## 2022-11-18 NOTE — Telephone Encounter (Signed)
Yes please bump up to 7 days per week. Thank you

## 2022-11-18 NOTE — Telephone Encounter (Signed)
Please See result note about adding on additional test.  Please order repeat TSH for 6 weeks.

## 2022-11-18 NOTE — Telephone Encounter (Signed)
Pt called office and I informed pt that she needed to bump her thyroid pill up to 7 days a week. She also states that you were going to see if you could add something on her to her labs? Does she automatically come back in 6 weeks to recheck labs and the order will be in there?

## 2022-11-24 ENCOUNTER — Other Ambulatory Visit: Payer: Self-pay

## 2022-11-24 DIAGNOSIS — E7849 Other hyperlipidemia: Secondary | ICD-10-CM

## 2022-11-24 DIAGNOSIS — E038 Other specified hypothyroidism: Secondary | ICD-10-CM

## 2022-11-24 NOTE — Progress Notes (Unsigned)
Per provider's note - patient is to repeat TSH testing in 6 weeks.

## 2022-11-24 NOTE — Telephone Encounter (Signed)
Task completed. Per lab, specimen no longer viable. Patient will come in on Wednesday to have additional labs drawn. Lab orders (2) placed in patient's chart.

## 2022-11-26 ENCOUNTER — Other Ambulatory Visit: Payer: Self-pay | Admitting: Family Medicine

## 2022-11-26 DIAGNOSIS — D539 Nutritional anemia, unspecified: Secondary | ICD-10-CM

## 2022-11-26 DIAGNOSIS — E038 Other specified hypothyroidism: Secondary | ICD-10-CM

## 2022-11-26 NOTE — Progress Notes (Signed)
Orders Placed This Encounter  Procedures   TSH   B12 and Folate Panel

## 2022-11-27 LAB — B12 AND FOLATE PANEL
Folate: >20 ng/mL (ref >3.0)
Vitamin B-12: 1678 pg/mL — ABNORMAL HIGH (ref 232–1245)

## 2022-11-27 LAB — TSH: TSH: 2.19 u[IU]/mL (ref 0.450–4.500)

## 2022-11-27 NOTE — Progress Notes (Signed)
Hi Adrain, B12 looks fantastic.  Folate also looks fantastic.  Thyroid looks great at 2.1.  In fact if you are taking some extra B12 you can decrease how often you are taking it

## 2022-12-05 ENCOUNTER — Other Ambulatory Visit: Payer: Self-pay | Admitting: Family Medicine

## 2022-12-13 ENCOUNTER — Other Ambulatory Visit: Payer: Self-pay | Admitting: Family Medicine

## 2022-12-24 ENCOUNTER — Encounter: Payer: Self-pay | Admitting: Family Medicine

## 2022-12-24 ENCOUNTER — Ambulatory Visit (INDEPENDENT_AMBULATORY_CARE_PROVIDER_SITE_OTHER): Payer: Medicare Other | Admitting: Family Medicine

## 2022-12-24 ENCOUNTER — Ambulatory Visit: Payer: Medicare Other

## 2022-12-24 VITALS — BP 110/47 | HR 71 | Ht 60.0 in | Wt 133.0 lb

## 2022-12-24 DIAGNOSIS — R5383 Other fatigue: Secondary | ICD-10-CM

## 2022-12-24 DIAGNOSIS — R0789 Other chest pain: Secondary | ICD-10-CM | POA: Diagnosis not present

## 2022-12-24 NOTE — Progress Notes (Signed)
Established Patient Office Visit  Subjective   Patient ID: Bonnie Duke, female    DOB: 1945-01-15  Age: 77 y.o. MRN: [id number]  Chief Complaint  Patient presents with   Hypothyroidism    HPI  She is here today because she has still not been feeling great.  We have actually checked her thyroid in October 30 and it was just slightly elevated at 4.5.  We made a slight adjustment to her thyroid regimen and she came back on November 13 and repeat TSH looked great at 2.1.  On her original CBC the MCV was a little elevated so we did have them checked a B12 and a folate.  B12 and folate were both elevated.  Definitely not deficient.  But she says she just does not really feel any better she still feels really tired  She has also been having some intermittent midsternal chest pain for the last month.  She describes it as a tightness.  It typically will last for few hours and then ease off when it does occur it is not daily.  She denies any shortness of breath or cough or recent cold symptoms.  She has no difficulty with swallowing.  No worsening or alleviating factors.  It does not get worse with activity.    ROS    Objective:     BP (!) 110/47   Pulse 71   Ht 5' (1.524 m)   Wt 133 lb (60.3 kg)   SpO2 98%   BMI 25.97 kg/m    Physical Exam Vitals and nursing note reviewed.  Constitutional:      Appearance: Normal appearance.  HENT:     Head: Normocephalic and atraumatic.  Eyes:     Conjunctiva/sclera: Conjunctivae normal.  Cardiovascular:     Rate and Rhythm: Normal rate and regular rhythm.  Pulmonary:     Effort: Pulmonary effort is normal.     Breath sounds: Normal breath sounds.  Skin:    General: Skin is warm and dry.  Neurological:     Mental Status: She is alert.  Psychiatric:        Mood and Affect: Mood normal.      No results found for any visits on 12/24/22.    The 10-year ASCVD risk score (Arnett DK, et al., 2019) is: 15.7%    Assessment & Plan:    Problem List Items Addressed This Visit   None Visit Diagnoses     Atypical chest pain    -  Primary   Relevant Orders   TSH   DG Chest 2 View   CBC with Differential/Platelet   CK (Creatine Kinase)   CMP14+EGFR   Vitamin B1   Vitamin B6   Sedimentation rate   EKG 12-Lead   Other fatigue       Relevant Orders   TSH   DG Chest 2 View   CBC with Differential/Platelet   CK (Creatine Kinase)   CMP14+EGFR   Vitamin B1   Vitamin B6   Sedimentation rate       Atypical chest pain with fatigue-repeat TSH was at therapeutic goal.  We did discuss further evaluation with an EKG, additional labs and chest x-ray.  And potentially adding an echocardiogram.  She has not had any type of arrhythmia or breathing issues which is reassuring.  Also consider it could be chest wall pain though it has been coming and going for almost a month at this point.  EKG today shows rate of 70  bpm, normal sinus rhythm with no acute ST-T wave changes.   No follow-ups on file.    Nani Gasser, MD

## 2022-12-29 NOTE — Progress Notes (Signed)
Hi River, your chest xray is normal.

## 2022-12-30 NOTE — Progress Notes (Signed)
Hi Bonnie Duke, thyroid level looks perfect at 2.  Blood counts normal no sign of anemia.  Muscle enzyme level is normal so no sign of excess muscle breakdown.  Liver and kidney function are normal.  Vitamin B1 looks great.  Inflammatory markers are normal.  Vitamin B6 is still pending.

## 2022-12-31 LAB — CMP14+EGFR
ALT: 10 [IU]/L (ref 0–32)
AST: 25 [IU]/L (ref 0–40)
Albumin: 4.6 g/dL (ref 3.8–4.8)
Alkaline Phosphatase: 71 [IU]/L (ref 44–121)
BUN/Creatinine Ratio: 22 (ref 12–28)
BUN: 19 mg/dL (ref 8–27)
Bilirubin Total: 0.3 mg/dL (ref 0.0–1.2)
CO2: 25 mmol/L (ref 20–29)
Calcium: 9.8 mg/dL (ref 8.7–10.3)
Chloride: 102 mmol/L (ref 96–106)
Creatinine, Ser: 0.87 mg/dL (ref 0.57–1.00)
Globulin, Total: 2.7 g/dL (ref 1.5–4.5)
Glucose: 80 mg/dL (ref 70–99)
Potassium: 4.1 mmol/L (ref 3.5–5.2)
Sodium: 142 mmol/L (ref 134–144)
Total Protein: 7.3 g/dL (ref 6.0–8.5)
eGFR: 69 mL/min/{1.73_m2} (ref >59)

## 2022-12-31 LAB — TSH: TSH: 2.05 u[IU]/mL (ref 0.450–4.500)

## 2022-12-31 LAB — CBC WITH DIFFERENTIAL/PLATELET
Basophils Absolute: 0 10*3/uL (ref 0.0–0.2)
Basos: 1 %
EOS (ABSOLUTE): 0.1 10*3/uL (ref 0.0–0.4)
Eos: 1 %
Hematocrit: 37.1 % (ref 34.0–46.6)
Hemoglobin: 12.3 g/dL (ref 11.1–15.9)
Immature Grans (Abs): 0 10*3/uL (ref 0.0–0.1)
Immature Granulocytes: 0 %
Lymphocytes Absolute: 2.5 10*3/uL (ref 0.7–3.1)
Lymphs: 35 %
MCH: 31.9 pg (ref 26.6–33.0)
MCHC: 33.2 g/dL (ref 31.5–35.7)
MCV: 96 fL (ref 79–97)
Monocytes Absolute: 0.5 10*3/uL (ref 0.1–0.9)
Monocytes: 8 %
Neutrophils Absolute: 3.9 10*3/uL (ref 1.4–7.0)
Neutrophils: 55 %
Platelets: 301 10*3/uL (ref 150–450)
RBC: 3.86 x10E6/uL (ref 3.77–5.28)
RDW: 11.9 % (ref 11.7–15.4)
WBC: 7 10*3/uL (ref 3.4–10.8)

## 2022-12-31 LAB — VITAMIN B6: Vitamin B6: 19.4 ug/L (ref 3.4–65.2)

## 2022-12-31 LAB — CK: Total CK: 63 U/L (ref 32–182)

## 2022-12-31 LAB — VITAMIN B1: Thiamine: 109 nmol/L (ref 66.5–200.0)

## 2022-12-31 LAB — SEDIMENTATION RATE: Sed Rate: 12 mm/h (ref 0–40)

## 2023-01-01 NOTE — Progress Notes (Signed)
B6 looks great!

## 2023-01-21 ENCOUNTER — Encounter: Payer: Self-pay | Admitting: Family Medicine

## 2023-01-21 DIAGNOSIS — Z1283 Encounter for screening for malignant neoplasm of skin: Secondary | ICD-10-CM

## 2023-02-12 ENCOUNTER — Ambulatory Visit: Payer: Self-pay | Admitting: Family Medicine

## 2023-02-22 DIAGNOSIS — S22018A Other fracture of first thoracic vertebra, initial encounter for closed fracture: Secondary | ICD-10-CM | POA: Diagnosis not present

## 2023-02-22 DIAGNOSIS — W19XXXA Unspecified fall, initial encounter: Secondary | ICD-10-CM | POA: Diagnosis not present

## 2023-02-22 DIAGNOSIS — S52502A Unspecified fracture of the lower end of left radius, initial encounter for closed fracture: Secondary | ICD-10-CM | POA: Diagnosis not present

## 2023-02-22 DIAGNOSIS — M2578 Osteophyte, vertebrae: Secondary | ICD-10-CM | POA: Diagnosis not present

## 2023-02-22 DIAGNOSIS — R296 Repeated falls: Secondary | ICD-10-CM | POA: Diagnosis not present

## 2023-02-22 DIAGNOSIS — S62101A Fracture of unspecified carpal bone, right wrist, initial encounter for closed fracture: Secondary | ICD-10-CM | POA: Diagnosis not present

## 2023-02-22 DIAGNOSIS — S32010A Wedge compression fracture of first lumbar vertebra, initial encounter for closed fracture: Secondary | ICD-10-CM | POA: Diagnosis not present

## 2023-02-23 DIAGNOSIS — S52502A Unspecified fracture of the lower end of left radius, initial encounter for closed fracture: Secondary | ICD-10-CM | POA: Diagnosis not present

## 2023-02-23 DIAGNOSIS — S6992XA Unspecified injury of left wrist, hand and finger(s), initial encounter: Secondary | ICD-10-CM | POA: Diagnosis not present

## 2023-02-23 DIAGNOSIS — S52612A Displaced fracture of left ulna styloid process, initial encounter for closed fracture: Secondary | ICD-10-CM | POA: Diagnosis not present

## 2023-02-24 DIAGNOSIS — S32010A Wedge compression fracture of first lumbar vertebra, initial encounter for closed fracture: Secondary | ICD-10-CM | POA: Diagnosis not present

## 2023-02-24 DIAGNOSIS — M545 Low back pain, unspecified: Secondary | ICD-10-CM | POA: Diagnosis not present

## 2023-03-02 DIAGNOSIS — S52592D Other fractures of lower end of left radius, subsequent encounter for closed fracture with routine healing: Secondary | ICD-10-CM | POA: Diagnosis not present

## 2023-03-02 DIAGNOSIS — S52502A Unspecified fracture of the lower end of left radius, initial encounter for closed fracture: Secondary | ICD-10-CM | POA: Diagnosis not present

## 2023-03-19 DIAGNOSIS — S52572D Other intraarticular fracture of lower end of left radius, subsequent encounter for closed fracture with routine healing: Secondary | ICD-10-CM | POA: Diagnosis not present

## 2023-03-19 DIAGNOSIS — S52502A Unspecified fracture of the lower end of left radius, initial encounter for closed fracture: Secondary | ICD-10-CM | POA: Diagnosis not present

## 2023-03-19 DIAGNOSIS — S52612D Displaced fracture of left ulna styloid process, subsequent encounter for closed fracture with routine healing: Secondary | ICD-10-CM | POA: Diagnosis not present

## 2023-03-19 DIAGNOSIS — S6992XA Unspecified injury of left wrist, hand and finger(s), initial encounter: Secondary | ICD-10-CM | POA: Diagnosis not present

## 2023-03-19 DIAGNOSIS — S52502D Unspecified fracture of the lower end of left radius, subsequent encounter for closed fracture with routine healing: Secondary | ICD-10-CM | POA: Diagnosis not present

## 2023-03-26 DIAGNOSIS — M5459 Other low back pain: Secondary | ICD-10-CM | POA: Diagnosis not present

## 2023-04-15 ENCOUNTER — Ambulatory Visit

## 2023-04-15 DIAGNOSIS — Z780 Asymptomatic menopausal state: Secondary | ICD-10-CM | POA: Diagnosis not present

## 2023-04-15 DIAGNOSIS — Z13820 Encounter for screening for osteoporosis: Secondary | ICD-10-CM

## 2023-04-15 DIAGNOSIS — Z0000 Encounter for general adult medical examination without abnormal findings: Secondary | ICD-10-CM

## 2023-04-15 DIAGNOSIS — M8589 Other specified disorders of bone density and structure, multiple sites: Secondary | ICD-10-CM | POA: Diagnosis not present

## 2023-04-16 DIAGNOSIS — S52502A Unspecified fracture of the lower end of left radius, initial encounter for closed fracture: Secondary | ICD-10-CM | POA: Diagnosis not present

## 2023-04-16 DIAGNOSIS — S52502D Unspecified fracture of the lower end of left radius, subsequent encounter for closed fracture with routine healing: Secondary | ICD-10-CM | POA: Diagnosis not present

## 2023-04-16 DIAGNOSIS — S52612D Displaced fracture of left ulna styloid process, subsequent encounter for closed fracture with routine healing: Secondary | ICD-10-CM | POA: Diagnosis not present

## 2023-04-29 ENCOUNTER — Encounter: Payer: Self-pay | Admitting: Family Medicine

## 2023-04-29 NOTE — Progress Notes (Signed)
 Call pt: we need to set up virtual visit to discuss results fo bone density dn discuss tx options with her fracture history.

## 2023-04-30 NOTE — Telephone Encounter (Signed)
-----   Message from Duaine German sent at 04/29/2023  8:07 PM EDT ----- Call pt: we need to set up virtual visit to discuss results fo bone density dn discuss tx options with her fracture history.

## 2023-05-05 DIAGNOSIS — M25639 Stiffness of unspecified wrist, not elsewhere classified: Secondary | ICD-10-CM | POA: Diagnosis not present

## 2023-05-07 DIAGNOSIS — M25531 Pain in right wrist: Secondary | ICD-10-CM | POA: Diagnosis not present

## 2023-05-07 DIAGNOSIS — M5459 Other low back pain: Secondary | ICD-10-CM | POA: Diagnosis not present

## 2023-05-11 ENCOUNTER — Ambulatory Visit (INDEPENDENT_AMBULATORY_CARE_PROVIDER_SITE_OTHER): Payer: Medicare Other | Admitting: Family Medicine

## 2023-05-11 ENCOUNTER — Encounter: Payer: Self-pay | Admitting: Family Medicine

## 2023-05-11 VITALS — BP 130/70 | HR 73 | Ht 60.0 in | Wt 132.0 lb

## 2023-05-11 DIAGNOSIS — M810 Age-related osteoporosis without current pathological fracture: Secondary | ICD-10-CM | POA: Insufficient documentation

## 2023-05-11 DIAGNOSIS — M8000XA Age-related osteoporosis with current pathological fracture, unspecified site, initial encounter for fracture: Secondary | ICD-10-CM | POA: Diagnosis not present

## 2023-05-11 DIAGNOSIS — S52592A Other fractures of lower end of left radius, initial encounter for closed fracture: Secondary | ICD-10-CM | POA: Diagnosis not present

## 2023-05-11 DIAGNOSIS — F3342 Major depressive disorder, recurrent, in full remission: Secondary | ICD-10-CM

## 2023-05-11 NOTE — Assessment & Plan Note (Signed)
 I did review her results with her T-score of -2.0 which is technically in the osteopenia category but the low impact fracture in her left wrist and her L-spine this would technically qualify her for osteoporosis treatment.  We discussed option of bisphosphonates, Prolia and even Evenity.  Recommendation would be to consider Evenity for 1 year then switch to Prolia for maintenance.  I wrote down the names of the medications and encouraged her to think about it.  She does take vitamin D but plans on starting calcium so encouraged her to pick up some Viactiv chews.  Also we discussed the importance of resistance exercise to help with osteoporosis as well

## 2023-05-11 NOTE — Assessment & Plan Note (Signed)
 She is doing really well on her citalopram happy with her current regimen.  Follow-up in 6 months.

## 2023-05-11 NOTE — Progress Notes (Signed)
 Established Patient Office Visit  Subjective  Patient ID: Bonnie Duke, female    DOB: [birth year]  Age: 78 y.o. MRN: [id number]  No chief complaint on file.   HPI  Since I last saw her she unfortunately experienced a fall.  She was helping her husband break down cardboard boxes and had stepped on the cardboard and it slipped underneath her and she fell.  She suffered a closed fracture of the distal left radius and also a fracture of her L2 spine.  She has been following up with [facility].  She is completely out of the cast for her wrist now and trying to do some stretches and exercises on her own at home is still little sore and tender.  She did come in and get a bone density done.    ROS    Objective:     BP 130/70   Pulse 73   Ht 5' (1.524 m)   Wt 132 lb (59.9 kg)   SpO2 97%   BMI 25.78 kg/m    Physical Exam Vitals and nursing note reviewed.  Constitutional:      Appearance: Normal appearance.  HENT:     Head: Normocephalic and atraumatic.  Eyes:     Conjunctiva/sclera: Conjunctivae normal.  Cardiovascular:     Rate and Rhythm: Normal rate and regular rhythm.  Pulmonary:     Effort: Pulmonary effort is normal.     Breath sounds: Normal breath sounds.  Skin:    General: Skin is warm and dry.  Neurological:     Mental Status: She is alert.  Psychiatric:        Mood and Affect: Mood normal.      No results found for any visits on 05/11/23.    The 10-year ASCVD risk score (Arnett DK, et al., 2019) is: 23.8%    Assessment & Plan:   Problem List Items Addressed This Visit       Musculoskeletal and Integument   Osteoporosis   I did review her results with her T-score of -2.0 which is technically in the osteopenia category but the low impact fracture in her left wrist and her L-spine this would technically qualify her for osteoporosis treatment.  We discussed option of bisphosphonates, Prolia and even Evenity.  Recommendation would be to  consider Evenity for 1 year then switch to Prolia for maintenance.  I wrote down the names of the medications and encouraged her to think about it.  She does take vitamin D but plans on starting calcium so encouraged her to pick up some Viactiv chews.  Also we discussed the importance of resistance exercise to help with osteoporosis as well        Other   Major depressive disorder, recurrent, in full remission (HCC) - Primary   She is doing really well on her citalopram happy with her current regimen.  Follow-up in 6 months.      Other Visit Diagnoses       Other closed fracture of distal end of left radius, initial encounter          Left radial wrist fracture-discussed and reviewed a few exercises for her to do on her own at home I am happy to refer her to PT she did have 1 session at [facility] but felt like they just raced through things and it was not that helpful she does have some of the papers at home so we did discuss maybe trying to do this on her own  if she felt confident to do so but again I am happy to refer her to PT for couple sessions if she feels like that would be helpful.  She still has a little bit of swelling in that left wrist.  No significant tenderness on exam which is great.  He still has a little bit of decreased range of motion but says it is getting a little better.  No follow-ups on file.    Duaine German, MD

## 2023-05-11 NOTE — Patient Instructions (Addendum)
 Viactiv Chews, chocolate or caramel,  that have calcium and vitamin D and taste pretty good. Chew one twice a day.    Think about Prolia or Evenity for your bones

## 2023-05-12 DIAGNOSIS — M25639 Stiffness of unspecified wrist, not elsewhere classified: Secondary | ICD-10-CM | POA: Diagnosis not present

## 2023-05-26 DIAGNOSIS — M25639 Stiffness of unspecified wrist, not elsewhere classified: Secondary | ICD-10-CM | POA: Diagnosis not present

## 2023-05-28 ENCOUNTER — Other Ambulatory Visit: Payer: Self-pay | Admitting: Family Medicine

## 2023-06-02 DIAGNOSIS — M25639 Stiffness of unspecified wrist, not elsewhere classified: Secondary | ICD-10-CM | POA: Diagnosis not present

## 2023-06-09 DIAGNOSIS — M25639 Stiffness of unspecified wrist, not elsewhere classified: Secondary | ICD-10-CM | POA: Diagnosis not present

## 2023-07-09 DIAGNOSIS — S32010A Wedge compression fracture of first lumbar vertebra, initial encounter for closed fracture: Secondary | ICD-10-CM | POA: Diagnosis not present

## 2023-08-31 ENCOUNTER — Other Ambulatory Visit: Payer: Self-pay | Admitting: Family Medicine

## 2023-09-07 DIAGNOSIS — D485 Neoplasm of uncertain behavior of skin: Secondary | ICD-10-CM | POA: Diagnosis not present

## 2023-09-07 DIAGNOSIS — L814 Other melanin hyperpigmentation: Secondary | ICD-10-CM | POA: Diagnosis not present

## 2023-09-07 DIAGNOSIS — X32XXXS Exposure to sunlight, sequela: Secondary | ICD-10-CM | POA: Diagnosis not present

## 2023-09-07 DIAGNOSIS — Z129 Encounter for screening for malignant neoplasm, site unspecified: Secondary | ICD-10-CM | POA: Diagnosis not present

## 2023-09-07 DIAGNOSIS — L820 Inflamed seborrheic keratosis: Secondary | ICD-10-CM | POA: Diagnosis not present

## 2023-09-07 DIAGNOSIS — L570 Actinic keratosis: Secondary | ICD-10-CM | POA: Diagnosis not present

## 2023-09-07 DIAGNOSIS — L821 Other seborrheic keratosis: Secondary | ICD-10-CM | POA: Diagnosis not present

## 2023-09-15 ENCOUNTER — Encounter: Payer: Self-pay | Admitting: Sports Medicine

## 2023-10-26 ENCOUNTER — Encounter: Payer: Self-pay | Admitting: Urgent Care

## 2023-10-26 ENCOUNTER — Ambulatory Visit: Admitting: Urgent Care

## 2023-10-26 ENCOUNTER — Ambulatory Visit: Payer: Self-pay

## 2023-10-26 ENCOUNTER — Ambulatory Visit: Payer: Self-pay | Admitting: Urgent Care

## 2023-10-26 ENCOUNTER — Ambulatory Visit (INDEPENDENT_AMBULATORY_CARE_PROVIDER_SITE_OTHER)

## 2023-10-26 VITALS — BP 114/69 | HR 87 | Ht 60.0 in | Wt 130.0 lb

## 2023-10-26 DIAGNOSIS — M25552 Pain in left hip: Secondary | ICD-10-CM

## 2023-10-26 DIAGNOSIS — M160 Bilateral primary osteoarthritis of hip: Secondary | ICD-10-CM | POA: Diagnosis not present

## 2023-10-26 MED ORDER — CELECOXIB 200 MG PO CAPS: 200.0000 mg | ORAL_CAPSULE | Freq: Two times a day (BID) | ORAL | 2 refills | Status: AC

## 2023-10-26 NOTE — Patient Instructions (Signed)
 Go to [address] for xray  Please take celebrex  twice daily.

## 2023-10-26 NOTE — Telephone Encounter (Signed)
 FYI Only or Action Required?: FYI only for provider.  Patient was last seen in primary care on 05/11/2023 by Alvan Dorothyann BIRCH, MD.  Called Nurse Triage reporting Knee Pain.  Symptoms began a week ago.  Interventions attempted: Prescription medications: muscle relaxer.  Symptoms are: unchanged.  Triage Disposition: See PCP When Office is Open (Within 3 Days)  Patient/caregiver understands and will follow disposition?: Yes  Copied from CRM [id number]. Topic: Clinical - Red Word Triage >> Oct 26, 2023  9:33 AM Dedra B wrote: [facility] that prompted transfer to Nurse Triage: Pt having pain in L knee up to groin that has worsened over the past 3 days. Warm transfer to nurse triage Reason for Disposition  [1] MODERATE pain (e.g., interferes with normal activities, limping) AND [2] present > 3 days  Answer Assessment - Initial Assessment Questions No available appts with pcp, scheduled with alternative provider, 10/26/23.  1. LOCATION and RADIATION: Where is the pain located?      Left knee to groin area 2. QUALITY: What does the pain feel like?  (e.g., sharp, dull, aching, burning)     aching 3. SEVERITY: How bad is the pain? What does it keep you from doing?   (Scale 1-10; or mild, moderate, severe)     8/10 4. ONSET: When did the pain start? Does it come and go, or is it there all the time?     Week ago 5. RECURRENT: Have you had this pain before? If Yes, ask: When, and what happened then?     no 6. AGGRAVATING FACTORS: What makes the knee pain worse? (e.g., walking, climbing stairs, running)     no 8. ASSOCIATED SYMPTOMS: Is there any swelling or redness of the knee?     no 9. OTHER SYMPTOMS: Do you have any other symptoms? (e.g., calf pain, chest pain, difficulty breathing, fever)     no  Protocols used: Knee Pain-A-AH

## 2023-10-26 NOTE — Telephone Encounter (Signed)
Pt has an OV today

## 2023-10-26 NOTE — Progress Notes (Unsigned)
 Established Patient Office Visit  Subjective:  Patient ID: Bonnie Duke, female    DOB: 1945-06-10  Age: 78 y.o. MRN: [id number]  Chief Complaint  Patient presents with   Groin Pain    HPI  Bonnie Duke is a 78 year old female who presents with left groin pain and leg swelling.  She has been experiencing left groin pain for the past week, which has worsened over the last several days. The pain is constant, stabbing, and rated as an eight out of ten in severity. It is exacerbated by walking and transitioning from sitting to standing. She has attempted to alleviate the pain using ice, heat, and Tylenol, but none have provided relief.  Recently, she has noticed swelling in her leg from the hip down to the knee, which is also the area where the pain radiates. No numbness, tingling, redness, warmth, urinary changes, weakness, or decreased sensation. This is the first occurrence of such hip pain, and she is unaware of any injury or event that might have triggered it.  Weight-bearing is painful, and it takes time for her to start moving, although the pain improves slightly once she gets going. Initially, she experiences significant pain with the first few steps, resulting in a limp. She has a history of back issues.   Pt also has osteopenia/ porosis. She took a pain med that was prescribed from the ER back in Feb which helped to some degree with the pain. She takes no daily medications for pain.       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 Past Surgical History:  Procedure Laterality Date   TUBAL LIGATION     Social History   Tobacco Use   Smoking status: Never   Smokeless tobacco: Never  Vaping Use   Vaping status: Never Used  Substance Use Topics   Alcohol use: Not Currently   Drug use: Never      ROS: as noted in HPI  Objective:     BP 114/69   Pulse 87   Ht 5' (1.524 m)   Wt 130 lb (59 kg)   SpO2 97%   BMI 25.39 kg/m  BP Readings from Last 3 Encounters:  10/26/23 114/69  05/11/23 130/70  12/24/22 (!) 110/47   Wt Readings from Last 3 Encounters:  10/26/23 130 lb (59 kg)  05/11/23 132 lb (59.9 kg)  12/24/22 133 lb (60.3 kg)      Physical Exam Vitals and nursing note reviewed. Exam conducted with a chaperone present.  Constitutional:      General: She is not in acute distress.    Appearance: Normal appearance. She is not ill-appearing, toxic-appearing or diaphoretic.  HENT:     Head: Normocephalic.  Cardiovascular:     Rate and Rhythm: Normal rate and regular rhythm.  Pulmonary:     Effort: Pulmonary effort is normal. No respiratory distress.  Musculoskeletal:     Left hip: No deformity, tenderness or bony tenderness. Decreased range of motion (some decreased ROM to external rotation).     Left upper leg: Tenderness (to palpation anteriorly) present. No swelling or edema.       Legs:     Comments: Positive faber test on L Pain to midline L thigh with radiation anteriorly to knee  Neurological:     Mental Status: She is alert.      No results found for any visits on 10/26/23.  Last CBC Lab Results  Component Value Date   WBC  7.0 12/24/2022   HGB 12.3 12/24/2022   HCT 37.1 12/24/2022   MCV 96 12/24/2022   MCH 31.9 12/24/2022   RDW 11.9 12/24/2022   PLT 301 12/24/2022   Last metabolic panel Lab Results  Component Value Date   GLUCOSE 80 12/24/2022   NA 142 12/24/2022   K 4.1 12/24/2022   CL 102 12/24/2022   CO2 25 12/24/2022   BUN 19 12/24/2022   CREATININE 0.87 12/24/2022   EGFR 69 12/24/2022   CALCIUM 9.8 12/24/2022   PROT 7.3 12/24/2022   ALBUMIN 4.6 12/24/2022   LABGLOB 2.7 12/24/2022   BILITOT 0.3 12/24/2022   ALKPHOS 71 12/24/2022   AST 25 12/24/2022   ALT 10 12/24/2022   Last lipids Lab Results  Component Value Date   CHOL 189 11/12/2022   HDL 89 11/12/2022   LDLCALC 89 11/12/2022   TRIG 61 11/12/2022   CHOLHDL 2.1 11/12/2022   Last hemoglobin A1c No results found for: HGBA1C    The 10-year ASCVD risk score (Arnett DK, et al., 2019) is: 19%  Assessment & Plan:  There are no diagnoses linked to this encounter.   No follow-ups on file.   Benton LITTIE Gave, PA

## 2023-11-04 NOTE — Telephone Encounter (Signed)
 Please call patient and see if she would like to get a flu shot we can schedule her.  Or if she plans to get at the pharmacy please let me know and we will decline on our care gap.

## 2023-11-13 DIAGNOSIS — M25571 Pain in right ankle and joints of right foot: Secondary | ICD-10-CM | POA: Diagnosis not present

## 2023-11-18 ENCOUNTER — Ambulatory Visit (INDEPENDENT_AMBULATORY_CARE_PROVIDER_SITE_OTHER): Admitting: Family Medicine

## 2023-11-18 ENCOUNTER — Encounter: Payer: Self-pay | Admitting: Family Medicine

## 2023-11-18 VITALS — BP 120/76 | HR 75 | Ht 60.0 in | Wt 131.0 lb

## 2023-11-18 DIAGNOSIS — R197 Diarrhea, unspecified: Secondary | ICD-10-CM

## 2023-11-18 DIAGNOSIS — Z0000 Encounter for general adult medical examination without abnormal findings: Secondary | ICD-10-CM | POA: Diagnosis not present

## 2023-11-18 DIAGNOSIS — E7849 Other hyperlipidemia: Secondary | ICD-10-CM | POA: Diagnosis not present

## 2023-11-18 DIAGNOSIS — E038 Other specified hypothyroidism: Secondary | ICD-10-CM | POA: Diagnosis not present

## 2023-11-18 NOTE — Progress Notes (Signed)
 Complete physical exam  Patient: Bonnie Duke    DOB: 03-24-1945 78 y.o.   MRN: [id number]  Chief Complaint  Patient presents with   Annual Exam    Subjective:    Bonnie Duke is a 78 y.o. female who presents today for a complete physical exam. She reports consuming a general diet. Does some yard work. Thinking about starting silver sneakers2 She generally feels well. She does  have additional problems to discuss today.   Discussed the use of AI scribe software for clinical note transcription with the patient, who gave verbal consent to proceed.  History of Present Illness Bonnie Duke is a 78 year old female who presents with right ankle pain and diarrhea.  Right ankle and foot pain - Right ankle pain evaluated by orthopedics; no fracture identified - Brace provided for support, but not worn today due to footwear limitations - Soreness localized to the bottom of the foot and ankle, worsened with twisting motion - Symptoms perceived to be possibly related to aging - Activity, including gardening and yard work, limited due to ankle pain  Diarrhea - Diarrhea present for approximately one month, occurring most days of the week - No blood in stool - No fever, night sweats, or significant abdominal pain - Bowel movements were normal this morning - No recent changes in medications or supplements - No similar symptoms in her husband   Most recent fall risk assessment:    11/18/2023   11:11 AM  Fall Risk   Falls in the past year? 1  Number falls in past yr: 0  Injury with Fall? 0  Risk for fall due to : No Fall Risks  Follow up Falls evaluation completed     Most recent depression screenings:    11/18/2023   11:14 AM 11/18/2023   11:11 AM  PHQ 2/9 Scores  PHQ - 2 Score 0 0  PHQ- 9 Score 0         Patient Care Team: Alvan Dorothyann BIRCH, MD as PCP - General (Family Medicine) Curtis Debby PARAS, MD (Inactive) as Consulting Physician (Sports  Medicine)   ROS    Objective:    BP 120/76   Pulse 75   Ht 5' (1.524 m)   Wt 131 lb 0.6 oz (59.4 kg)   SpO2 99%   BMI 25.59 kg/m     Physical Exam Vitals and nursing note reviewed.  Constitutional:      Appearance: Normal appearance.  HENT:     Head: Normocephalic and atraumatic.     Right Ear: Tympanic membrane, ear canal and external ear normal.     Left Ear: Tympanic membrane, ear canal and external ear normal.     Nose: Nose normal.     Mouth/Throat:     Pharynx: Oropharynx is clear.  Eyes:     Extraocular Movements: Extraocular movements intact.     Conjunctiva/sclera: Conjunctivae normal.     Pupils: Pupils are equal, round, and reactive to light.  Neck:     Thyroid: No thyromegaly.  Cardiovascular:     Rate and Rhythm: Normal rate and regular rhythm.  Pulmonary:     Effort: Pulmonary effort is normal.     Breath sounds: Normal breath sounds.  Abdominal:     General: Bowel sounds are normal.     Palpations: Abdomen is soft.     Tenderness: There is no abdominal tenderness.  Musculoskeletal:        General: No swelling.  Cervical back: Neck supple.  Skin:    General: Skin is warm and dry.  Neurological:     Mental Status: She is alert and oriented to person, place, and time.  Psychiatric:        Mood and Affect: Mood normal.        Behavior: Behavior normal.       No results found for any visits on 11/18/23.      Assessment & Plan:    Routine Health Maintenance and Physical Exam Immunization History  Administered Date(s) Administered   INFLUENZA, HIGH DOSE SEASONAL PF 10/31/2013, 11/08/2016, 12/16/2018   Influenza, Quadrivalent, Recombinant, Inj, Pf 12/16/2018   Influenza,inj,Quad PF,6-35 Mos 11/05/2017   Influenza,inj,quad, With Preservative 12/16/2018   Pneumococcal Polysaccharide-23 07/08/2010   Rabies, IM 03/12/2022, 03/15/2022, 03/19/2022, 03/26/2022   Tdap 06/14/2007, 10/26/2017, 03/12/2022   Zoster, Live 03/10/2012    Health  Maintenance  Topic Date Due   Hepatitis C Screening  Never done   Zoster Vaccines- Shingrix (1 of 2) 01/06/1995   Pneumococcal Vaccine: 50+ Years (2 of 2 - PCV) 07/08/2011   COVID-19 Vaccine (1 - 2025-26 season) Never done   Medicare Annual Wellness (AWV)  11/04/2023   Influenza Vaccine  04/12/2024 (Originally 08/14/2023)   DEXA SCAN  04/15/2026   DTaP/Tdap/Td (4 - Td or Tdap) 03/12/2032   Meningococcal B Vaccine  Aged Out   Colonoscopy  Discontinued    Discussed health benefits of physical activity, and encouraged her to engage in regular exercise appropriate for her age and condition.  Problem List Items Addressed This Visit       Endocrine   Hypothyroid   Relevant Orders   CMP14+EGFR   Lipid panel   CBC   TSH     Other   Hyperlipemia   Relevant Orders   CMP14+EGFR   Lipid panel   CBC   TSH   Other Visit Diagnoses       Wellness examination    -  Primary     Diarrhea, unspecified type       Relevant Orders   CMP14+EGFR   Lipid panel   CBC   TSH   GI Profile, Stool, PCR      Will get uptodat labs today.   Assessment and Plan Assessment & Plan Chronic diarrhea Diarrhea for one month, no blood, fever, or significant pain. Differential includes infectious causes. - Ordered stool sample for salmonella, Shigella, E. coli. - Advised on hydration. - Consider gastroenterology referral if stool sample negative and symptoms persist.  Right ankle pain Soreness possibly due to arthritis, no fracture noted. - Recommended lace-up sneakers for support. - Encouraged continued brace use as needed.    No follow-ups on file.    Dorothyann Byars, MD [facility] at [facility]

## 2023-11-19 DIAGNOSIS — R197 Diarrhea, unspecified: Secondary | ICD-10-CM | POA: Diagnosis not present

## 2023-11-19 LAB — CMP14+EGFR
ALT: 10 IU/L (ref 0–32)
AST: 29 IU/L (ref 0–40)
Albumin: 4.4 g/dL (ref 3.8–4.8)
Alkaline Phosphatase: 81 IU/L (ref 49–135)
BUN/Creatinine Ratio: 19 (ref 12–28)
BUN: 14 mg/dL (ref 8–27)
Bilirubin Total: 0.3 mg/dL (ref 0.0–1.2)
CO2: 19 mmol/L — ABNORMAL LOW (ref 20–29)
Calcium: 9.2 mg/dL (ref 8.7–10.3)
Chloride: 100 mmol/L (ref 96–106)
Creatinine, Ser: 0.75 mg/dL (ref 0.57–1.00)
Globulin, Total: 2.8 g/dL (ref 1.5–4.5)
Glucose: 68 mg/dL — ABNORMAL LOW (ref 70–99)
Potassium: 4.4 mmol/L (ref 3.5–5.2)
Sodium: 139 mmol/L (ref 134–144)
Total Protein: 7.2 g/dL (ref 6.0–8.5)
eGFR: 81 mL/min/1.73 (ref >59)

## 2023-11-19 LAB — CBC
Hematocrit: 34.7 % (ref 34.0–46.6)
Hemoglobin: 11.2 g/dL (ref 11.1–15.9)
MCH: 30.8 pg (ref 26.6–33.0)
MCHC: 32.3 g/dL (ref 31.5–35.7)
MCV: 95 fL (ref 79–97)
Platelets: 305 x10E3/uL (ref 150–450)
RBC: 3.64 x10E6/uL — ABNORMAL LOW (ref 3.77–5.28)
RDW: 11.9 % (ref 11.7–15.4)
WBC: 8.4 x10E3/uL (ref 3.4–10.8)

## 2023-11-19 LAB — LIPID PANEL
Chol/HDL Ratio: 2.2 ratio (ref 0.0–4.4)
Cholesterol, Total: 173 mg/dL (ref 100–199)
HDL: 77 mg/dL (ref >39)
LDL Chol Calc (NIH): 81 mg/dL (ref 0–99)
Triglycerides: 81 mg/dL (ref 0–149)
VLDL Cholesterol Cal: 15 mg/dL (ref 5–40)

## 2023-11-19 LAB — TSH: TSH: 1.38 u[IU]/mL (ref 0.450–4.500)

## 2023-11-21 LAB — GI PROFILE, STOOL, PCR

## 2023-11-23 ENCOUNTER — Ambulatory Visit: Payer: Self-pay | Admitting: Family Medicine

## 2023-11-23 DIAGNOSIS — R197 Diarrhea, unspecified: Secondary | ICD-10-CM

## 2023-11-23 NOTE — Progress Notes (Signed)
 Hi Bonnie Duke, hemoglobin looks good.  Kidney function is stable.  The stool test came back negative for any specific bacteria's or norovirus which is good and reassuring but still does not explain your diarrhea.  Next up would be to get you in with GI for further workup would you like to do that?   Cholesterol and thyroid levels look good.

## 2023-11-25 ENCOUNTER — Other Ambulatory Visit: Payer: Self-pay | Admitting: Family Medicine

## 2023-11-25 DIAGNOSIS — R197 Diarrhea, unspecified: Secondary | ICD-10-CM

## 2023-11-25 NOTE — Progress Notes (Signed)
 Orders Placed This Encounter  Procedures  . Ambulatory referral to Gastroenterology    Referral Priority:  Routine    Referral Type:  Consultation    Referral Reason:  Specialty Services Required    Number of Visits Requested:  1

## 2023-11-25 NOTE — Telephone Encounter (Signed)
 Copied from CRM [id number]. Topic: General - Other >> Nov 25, 2023  9:21 AM Antwanette L wrote: Reason for CRM: The patient is returning a missed call from Tonya regarding a referral to a GI specialist for further evaluation and workup. The patient is requesting a callback at [phone]

## 2023-11-25 NOTE — Telephone Encounter (Signed)
 Spoke with the patient and she stated that she didn't want a referral if all they were going to do was a colonoscopy, because she doesn't want to go through that and if they do find cancer she is not going through chemo. I did advise colonoscopy may not be the first step in their workup, she then agreed to go and see what they recommended.

## 2023-11-29 ENCOUNTER — Other Ambulatory Visit: Payer: Self-pay | Admitting: Family Medicine

## 2023-12-29 ENCOUNTER — Ambulatory Visit: Payer: Self-pay

## 2023-12-29 NOTE — Telephone Encounter (Signed)
 FYI Only or Action Required?: Action required by provider: update on patient condition.  Patient was last seen in primary care on 11/18/2023 by Bonnie Dorothyann BIRCH, MD.  Called Nurse Triage reporting Cough.  Symptoms began yesterday.  Interventions attempted: OTC medications: cough drops.  Symptoms are: unchanged.  Triage Disposition: Home Care  Patient/caregiver understands and will follow disposition?: Yes   Copied from CRM #[id number]. Topic: Clinical - Red Word Triage >> Dec 29, 2023 10:51 AM Bonnie Duke wrote: [facility] that prompted transfer to Nurse Triage: Patient thinks she might has a sinus infection, has a cough, itchy throat and when she bends over she has pain in her face. Reason for Disposition  Cough with cold symptoms (e.g., runny nose, postnasal drip, throat clearing)  Answer Assessment - Initial Assessment Questions Pt called in requesting home care options for cough and itching throat; pt reports hx of sinus infections and would like to tx early symptoms at home to avoid progression. Discussed anti-histamines for itchy throat and cough; zyrtec or claritin daily and benadryl at bedtime d/t drowsiness. Pt reports she did not sleep well d/t cough. Discussed cough suppressant d/t pt denying mucous. Pt voiced understanding and discussed she would call back if symptoms did not improve in the next 24-48h.   1. ONSET: When did the cough begin?      Yesterday, 12/15  2. SEVERITY: How bad is the cough today?      Dry cough; states no mucous. Pt reports she has a hx of sinus infections and wants to get ahead of cough at home before it progresses   3. SPUTUM: Describe the color of your sputum (e.g., none, dry cough; clear, white, yellow, green)     None   4. HEMOPTYSIS: Are you coughing up any blood? If Yes, ask: How much? (e.g., flecks, streaks, tablespoons, etc.)     None   5. DIFFICULTY BREATHING: Are you having difficulty breathing? If Yes, ask: How bad is  it? (e.g., mild, moderate, severe)      None  6. FEVER: Do you have a fever? If Yes, ask: What is your temperature, how was it measured, and when did it start?     None; pt reports she has low body temperature at baseline and does not run fevers  10. OTHER SYMPTOMS: Do you have any other symptoms? (e.g., runny nose, wheezing, chest pain)       None  Protocols used: Cough - Acute Non-Productive-A-AH

## 2024-01-25 ENCOUNTER — Ambulatory Visit: Payer: Self-pay

## 2024-01-25 NOTE — Telephone Encounter (Signed)
 Patient rescheduled for tomorrow.

## 2024-01-25 NOTE — Telephone Encounter (Signed)
 FYI Only or Action Required?: FYI only for provider: appointment scheduled on 1/15.  Patient was last seen in primary care on 11/18/2023 by Bonnie Dorothyann BIRCH, MD.  Called Nurse Triage reporting Cough.  Symptoms began 6 weeks ago.  Interventions attempted: OTC medications: Mucinex, Delysm.  Symptoms are: gradually worsening.  Triage Disposition: See PCP Within 2 Weeks  Patient/caregiver understands and will follow disposition?: Yes    Productive cough with dark yellow sputum x6 weeks with head and chest congestion. No SOB. No CP. No fever. Appt scheduled on 1/15. Advised UC or ED for worsening symptoms.     Copied from CRM #[id number]. Topic: Clinical - Red Word Triage >> Jan 25, 2024 12:46 PM Mercer PEDLAR wrote: Red Word that prompted transfer to Nurse Triage: Productive cough. Reason for Disposition  [1] Cough lasts > 3 weeks AND [2] recent cold symptoms (e.g., runny nose, fever)  Answer Assessment - Initial Assessment Questions 1. ONSET: When did the cough begin?      6 weeks ago  2. SEVERITY: How bad is the cough today?      Moderate  3. SPUTUM: Describe the color of your sputum (e.g., none, dry cough; clear, white, yellow, green)     Dark yellow  4. HEMOPTYSIS: Are you coughing up any blood? If so ask: How much? (e.g., flecks, streaks, tablespoons, etc.)     Denies  5. DIFFICULTY BREATHING: Are you having difficulty breathing? If Yes, ask: How bad is it? (e.g., mild, moderate, severe)      Denies  6. FEVER: Do you have a fever? If Yes, ask: What is your temperature, how was it measured, and when did it start?     Denies  7. CARDIAC HISTORY: Do you have any history of heart disease? (e.g., heart attack, congestive heart failure)      Denies  8. LUNG HISTORY: Do you have any history of lung disease?  (e.g., pulmonary embolus, asthma, emphysema)     Denies  9. PE RISK FACTORS: Do you have a history of blood clots? (or: recent major surgery,  recent prolonged travel, bedridden)     Denies  10. OTHER SYMPTOMS: Do you have any other symptoms? (e.g., runny nose, wheezing, chest pain)       Chest and head congestion, runny nose  Protocols used: Cough - Chronic-A-AH

## 2024-01-26 ENCOUNTER — Ambulatory Visit: Admitting: Family Medicine

## 2024-01-26 ENCOUNTER — Ambulatory Visit

## 2024-01-26 ENCOUNTER — Encounter: Payer: Self-pay | Admitting: Family Medicine

## 2024-01-26 VITALS — BP 120/70 | HR 77 | Ht 60.0 in | Wt 127.0 lb

## 2024-01-26 DIAGNOSIS — J22 Unspecified acute lower respiratory infection: Secondary | ICD-10-CM | POA: Diagnosis not present

## 2024-01-26 DIAGNOSIS — R052 Subacute cough: Secondary | ICD-10-CM | POA: Diagnosis not present

## 2024-01-26 MED ORDER — DOXYCYCLINE HYCLATE 100 MG PO TABS: 100.0000 mg | ORAL_TABLET | Freq: Two times a day (BID) | ORAL | 0 refills | Status: AC

## 2024-01-26 NOTE — Progress Notes (Signed)
 Pt stated that she hasn't been feeling well since the week before christmas. She states that it has now been about 6 weeks since she has not been feeling well and her sxs have been cough, post nasal congestion, and throat clearing. She has tried Mucinex-DM and Delsym. Denies any fevers.

## 2024-01-26 NOTE — Progress Notes (Signed)
 "  Acute Office Visit  Patient ID: Bonnie Duke, female    DOB: 04-05-1945, 79 y.o.   MRN: [id number]  PCP: Alvan Dorothyann BIRCH, MD  Chief Complaint  Patient presents with   Cough    Subjective:     HPI  Discussed the use of AI scribe software for clinical note transcription with the patient, who gave verbal consent to proceed.  History of Present Illness Bonnie Duke is a 79 year old female who presents with a persistent cough and drainage for six weeks. She is accompanied by her husband, Norleen.  Cough and upper respiratory symptoms - Persistent cough and drainage for approximately six weeks, beginning before Thanksgiving - Cough is mostly dry with occasional mucus production - Constant drainage, particularly bothersome today - No chest pain or wheezing - No fever - No sinus congestion or pressure - No blood in cough - No night sweats - No unusual rashes  Constitutional symptoms - Decreased appetite and energy levels - Exhaustion and unintentional weight loss - Weight at home is 125 pounds, previously wore size ten clothing, now size eight - Weight at clinic is 127 pounds, down about 4 lbs   Gastrointestinal symptoms - No diarrhea - Bowel movements have improved recently - No increased heartburn or reflux - Nighttime symptoms have improved, though previously bothersome  Epidemiological exposure - Sister and other family members have experienced similar symptoms, suggesting possible shared illness   ROS     Objective:    BP 120/70   Pulse 77   Ht 5' (1.524 m)   Wt 127 lb (57.6 kg)   SpO2 100%   BMI 24.80 kg/m    Physical Exam Constitutional:      Appearance: Normal appearance.  HENT:     Head: Normocephalic and atraumatic.     Right Ear: Tympanic membrane, ear canal and external ear normal. There is no impacted cerumen.     Left Ear: Tympanic membrane, ear canal and external ear normal. There is no impacted cerumen.     Nose: Nose normal.      Mouth/Throat:     Pharynx: Oropharynx is clear.  Eyes:     Conjunctiva/sclera: Conjunctivae normal.  Cardiovascular:     Rate and Rhythm: Normal rate and regular rhythm.  Pulmonary:     Effort: Pulmonary effort is normal.     Breath sounds: Normal breath sounds.  Musculoskeletal:     Cervical back: Neck supple. No tenderness.  Lymphadenopathy:     Cervical: No cervical adenopathy.  Skin:    General: Skin is warm and dry.  Neurological:     Mental Status: She is alert and oriented to person, place, and time.  Psychiatric:        Mood and Affect: Mood normal.       No results found for any visits on 01/26/24.     Assessment & Plan:   Problem List Items Addressed This Visit   None Visit Diagnoses       Subacute cough    -  Primary   Relevant Orders   CBC with Differential/Platelet   CMP14+EGFR   DG Chest 2 View     Lower respiratory infection       Relevant Medications   doxycycline  (VIBRA -TABS) 100 MG tablet       Assessment and Plan Assessment & Plan Chronic cough with postnasal drainage Persisting dry cough with occasional mucus, slight wheeze noted. Differential includes postnasal drip and potential pneumonia. - Ordered chest x-ray  to rule out pneumonia. - Ordered blood work. - Prescribed antibiotic.  Unintentional weight loss and decreased appetite Unintentional weight loss with decreased appetite and early satiety. Concern for underlying cause given chronic cough and weight loss. - Ordered blood work to investigate potential underlying causes.   Meds ordered this encounter  Medications   doxycycline  (VIBRA -TABS) 100 MG tablet    Sig: Take 1 tablet (100 mg total) by mouth 2 (two) times daily.    Dispense:  14 tablet    Refill:  0    No follow-ups on file.  Dorothyann Byars, MD [facility]   "

## 2024-01-27 ENCOUNTER — Ambulatory Visit: Payer: Self-pay | Admitting: Family Medicine

## 2024-01-27 LAB — CMP14+EGFR
ALT: 8 IU/L (ref 0–32)
AST: 24 IU/L (ref 0–40)
Albumin: 4.5 g/dL (ref 3.8–4.8)
Alkaline Phosphatase: 79 IU/L (ref 49–135)
BUN/Creatinine Ratio: 18 (ref 12–28)
BUN: 14 mg/dL (ref 8–27)
Bilirubin Total: <0.2 mg/dL (ref 0.0–1.2)
CO2: 23 mmol/L (ref 20–29)
Calcium: 9.4 mg/dL (ref 8.7–10.3)
Chloride: 105 mmol/L (ref 96–106)
Creatinine, Ser: 0.79 mg/dL (ref 0.57–1.00)
Globulin, Total: 2.8 g/dL (ref 1.5–4.5)
Glucose: 70 mg/dL (ref 70–99)
Potassium: 4.1 mmol/L (ref 3.5–5.2)
Sodium: 142 mmol/L (ref 134–144)
Total Protein: 7.3 g/dL (ref 6.0–8.5)
eGFR: 76 mL/min/1.73

## 2024-01-27 LAB — CBC WITH DIFFERENTIAL/PLATELET
Basophils Absolute: 0.1 x10E3/uL (ref 0.0–0.2)
Basos: 1 %
EOS (ABSOLUTE): 0.2 x10E3/uL (ref 0.0–0.4)
Eos: 2 %
Hematocrit: 34.7 % (ref 34.0–46.6)
Hemoglobin: 11.1 g/dL (ref 11.1–15.9)
Immature Grans (Abs): 0 x10E3/uL (ref 0.0–0.1)
Immature Granulocytes: 0 %
Lymphocytes Absolute: 2.7 x10E3/uL (ref 0.7–3.1)
Lymphs: 37 %
MCH: 30.3 pg (ref 26.6–33.0)
MCHC: 32 g/dL (ref 31.5–35.7)
MCV: 95 fL (ref 79–97)
Monocytes Absolute: 0.7 x10E3/uL (ref 0.1–0.9)
Monocytes: 10 %
Neutrophils Absolute: 3.7 x10E3/uL (ref 1.4–7.0)
Neutrophils: 50 %
Platelets: 326 x10E3/uL (ref 150–450)
RBC: 3.66 x10E6/uL — ABNORMAL LOW (ref 3.77–5.28)
RDW: 12.4 % (ref 11.7–15.4)
WBC: 7.4 x10E3/uL (ref 3.4–10.8)

## 2024-01-27 NOTE — Progress Notes (Signed)
 Your lab work is within acceptable range and there are no concerning findings.   ?

## 2024-01-27 NOTE — Telephone Encounter (Signed)
 Spoke with patient  informed of lab results.  She is concerned over the low RBC result that has been low 3 times within the last year. Patient is Wanting to know Dr. Vernida thoughts on this .

## 2024-01-28 ENCOUNTER — Ambulatory Visit: Admitting: Family Medicine

## 2024-01-29 NOTE — Progress Notes (Signed)
 Honestly I am not too concerned if you look back she always hangs in that 3 range.  Sometimes it is technically above the normal and then sometimes it is below but it really stays in most the same.  I really think this is just her normal baseline for her.

## 2024-02-02 NOTE — Progress Notes (Signed)
 Hi Bonnie Duke, great news chest x-ray looks good.
